# Patient Record
Sex: Female | Born: 1975 | Race: Asian | Hispanic: No | Marital: Married | Smoking: Never smoker
Health system: Southern US, Community
[De-identification: ages and names within clinical notes are randomized; demographics above are authoritative.]

## PROBLEM LIST (undated history)

## (undated) ENCOUNTER — Inpatient Hospital Stay (HOSPITAL_COMMUNITY): Payer: Self-pay

## (undated) DIAGNOSIS — F32A Depression, unspecified: Secondary | ICD-10-CM

## (undated) DIAGNOSIS — O24419 Gestational diabetes mellitus in pregnancy, unspecified control: Secondary | ICD-10-CM

## (undated) DIAGNOSIS — Z9889 Other specified postprocedural states: Secondary | ICD-10-CM

## (undated) DIAGNOSIS — D649 Anemia, unspecified: Secondary | ICD-10-CM

## (undated) DIAGNOSIS — F329 Major depressive disorder, single episode, unspecified: Secondary | ICD-10-CM

## (undated) HISTORY — PX: CHOLECYSTECTOMY: SHX55

---

## 2014-04-12 NOTE — L&D Delivery Note (Signed)
Delivery Note At 8:41 PM a non-viable female was delivered via Vaginal, Spontaneous Delivery (Presentation:double footling breech, encaul ).  APGAR: 1, 1; weight 9.1 oz (258 g).   Placenta status: Retained.  Cord: Unknown with the following complications: Prolapsed.- Avulsed   Anesthesia: None  Episiotomy: None Lacerations: None Suture Repair: N/A Est. Blood Loss (mL): 300 after D&E 1500  Mom to GYN.  Baby to stay with parents.  Bovard-Stuckert, Jody 01/10/2015, 10:40 PM  Pt with [facility] after avulsed cord - to OR - with [facility] - delay secondary to other complicated cases.

## 2014-12-31 NOTE — H&P (Signed)
Brianna Hall is an 39 y.o. female. G2P1001 who will be 18 wks and 4 days gestational age on 01/02/15 with EDC of 06/01/15 and is to present for scheduled cervical cerclage. Pt has had an uncomplicated pregnancy course thus far. She begun pregnancy care at 10 weeks and dates were confirmed via u/s. Early genetic screen (panorama) and CF were negative. Pt had a c/s with last pregnancy and planned on a repeat c/s this time. At anatomy scan today, cervix was noted to be significantly shortened at 0.87cm. Vaginal exam was done and confirmed this finding; cervical os was noted to be closed via digital exam today.  Implications and risk for preterm delivery were discussed. Pt is to start on vaginal progesterone today and then switch to 17a-hydroxy caproate injections next week ( order placed). Pt is also to get a cervical cerclage for which she presents today  Pertinent Gynecological History: Menses: LMP 08/25/14; pt is pregnant Bleeding: none Contraception: none DES exposure: unknown Blood transfusions: none Sexually transmitted diseases: no past history Previous GYN Procedures: none  Last mammogram: none Date:  Last pap: normal Date: 11/04/14 OB History: G2, P1001   Menstrual History: Menarche age: 59  No LMP recorded.    No past medical history on file.  No past surgical history on file.  No family history on file.  Social History:  has no tobacco, alcohol, and drug history on file.  Allergies: Allergies not on file  No prescriptions prior to admission    Review of Systems  Constitutional: Negative for fever and chills.  Respiratory: Negative for shortness of breath.   Cardiovascular: Negative for chest pain.  Gastrointestinal: Negative for heartburn, nausea, vomiting and abdominal pain.  Genitourinary: Negative for dysuria.  Musculoskeletal: Negative for back pain.  Neurological: Negative for dizziness and headaches.  Psychiatric/Behavioral: Negative for depression and suicidal  ideas. The patient is nervous/anxious.     There were no vitals taken for this visit. Physical Exam  Constitutional: She is oriented to person, place, and time. She appears well-developed and well-nourished.  HENT:  Head: Normocephalic.  Neck: Normal range of motion.  Respiratory: Effort normal.  GI: Soft.  Genitourinary: Vagina normal.  Shortened cervix; os closed. Measures 0.87cm on u/s  Musculoskeletal: Normal range of motion.  Neurological: She is alert and oriented to person, place, and time.  Skin: Skin is warm.  Psychiatric: She has a normal mood and affect. Her behavior is normal. Judgment and thought content normal.    No results found for this or any previous visit (from the past 24 hour(s)).  No results found.  Assessment/Plan: 38yo G2P1001 at 18 wks and 2days gestational age today for cervical cerclage in two days due to shortened cervix likely due to cervical insufficiency. Viable fetus confirmed today and to be confirmed via doppler prior to procedure and afterwards  Edwinna Areola 12/31/2014, 4:32 PM

## 2015-01-01 ENCOUNTER — Encounter (HOSPITAL_COMMUNITY): Payer: Self-pay | Admitting: *Deleted

## 2015-01-02 ENCOUNTER — Encounter (HOSPITAL_COMMUNITY): Payer: Self-pay | Admitting: Emergency Medicine

## 2015-01-02 ENCOUNTER — Ambulatory Visit (HOSPITAL_COMMUNITY): Payer: 59 | Admitting: Anesthesiology

## 2015-01-02 ENCOUNTER — Encounter (HOSPITAL_COMMUNITY)
Admission: RE | Disposition: A | Discharge status: Home / Self Care | Payer: Self-pay | Source: Ambulatory Visit | Attending: Obstetrics and Gynecology

## 2015-01-02 ENCOUNTER — Ambulatory Visit (HOSPITAL_COMMUNITY)
Admission: RE | Admit: 2015-01-02 | Discharge: 2015-01-02 | Disposition: A | Discharge status: Home / Self Care | Payer: 59 | Source: Ambulatory Visit | Attending: Obstetrics and Gynecology | Admitting: Obstetrics and Gynecology

## 2015-01-02 DIAGNOSIS — O26872 Cervical shortening, second trimester: Secondary | ICD-10-CM

## 2015-01-02 DIAGNOSIS — O3432 Maternal care for cervical incompetence, second trimester: Secondary | ICD-10-CM | POA: Insufficient documentation

## 2015-01-02 DIAGNOSIS — Z3A18 18 weeks gestation of pregnancy: Secondary | ICD-10-CM | POA: Diagnosis not present

## 2015-01-02 HISTORY — PX: CERVICAL CERCLAGE: SHX1329

## 2015-01-02 LAB — CBC
HCT: 35.3 % — ABNORMAL LOW (ref 36.0–46.0)
Hemoglobin: 12 g/dL (ref 12.0–15.0)
MCH: 31.3 pg (ref 26.0–34.0)
MCHC: 34 g/dL (ref 30.0–36.0)
MCV: 91.9 fL (ref 78.0–100.0)
PLATELETS: 279 10*3/uL (ref 150–400)
RBC: 3.84 MIL/uL — AB (ref 3.87–5.11)
RDW: 12.7 % (ref 11.5–15.5)
WBC: 10.8 10*3/uL — ABNORMAL HIGH (ref 4.0–10.5)

## 2015-01-02 LAB — TYPE AND SCREEN
ABO/RH(D): B POS
ANTIBODY SCREEN: NEGATIVE

## 2015-01-02 LAB — ABO/RH: ABO/RH(D): B POS

## 2015-01-02 SURGERY — CERCLAGE, CERVIX, VAGINAL APPROACH
Anesthesia: Spinal

## 2015-01-02 MED ORDER — LACTATED RINGERS IV SOLN
INTRAVENOUS | Status: DC
  Administered 2015-01-02 (×2): via INTRAVENOUS

## 2015-01-02 MED ORDER — FENTANYL CITRATE (PF) 100 MCG/2ML IJ SOLN: 25.0000 ug | INTRAMUSCULAR | Status: DC | PRN

## 2015-01-02 MED ORDER — METOCLOPRAMIDE HCL 5 MG/ML IJ SOLN: 10.0000 mg | Freq: Once | INTRAMUSCULAR | Status: DC | PRN

## 2015-01-02 MED ORDER — 0.9 % SODIUM CHLORIDE (POUR BTL) OPTIME
TOPICAL | Status: DC | PRN
  Administered 2015-01-02: 1000 mL

## 2015-01-02 MED ORDER — LIDOCAINE-EPINEPHRINE 1 %-1:100000 IJ SOLN
INTRAMUSCULAR | Status: AC
  Filled 2015-01-02: qty 1

## 2015-01-02 MED ORDER — MEPERIDINE HCL 25 MG/ML IJ SOLN: 6.2500 mg | INTRAMUSCULAR | Status: DC | PRN

## 2015-01-02 MED ORDER — LACTATED RINGERS IV SOLN
INTRAVENOUS | Status: DC
  Administered 2015-01-02: 15:00:00 via INTRAVENOUS

## 2015-01-02 MED ORDER — BUPIVACAINE IN DEXTROSE 0.75-8.25 % IT SOLN
INTRATHECAL | Status: DC | PRN
  Administered 2015-01-02: 1.2 mL via INTRATHECAL

## 2015-01-02 SURGICAL SUPPLY — 17 items
CLOTH BEACON ORANGE TIMEOUT ST (SAFETY) ×3 IMPLANT
COUNTER NEEDLE 1200 MAGNETIC (NEEDLE) ×3 IMPLANT
GLOVE BIO SURGEON STRL SZ 6.5 (GLOVE) ×4 IMPLANT
GLOVE BIO SURGEONS STRL SZ 6.5 (GLOVE) ×2
GOWN STRL REUS W/TWL LRG LVL3 (GOWN DISPOSABLE) ×6 IMPLANT
NEEDLE MAYO .5 CIRCLE (NEEDLE) ×3 IMPLANT
PACK VAGINAL MINOR WOMEN LF (CUSTOM PROCEDURE TRAY) ×3 IMPLANT
PAD OB MATERNITY 4.3X12.25 (PERSONAL CARE ITEMS) ×3 IMPLANT
PAD PREP 24X48 CUFFED NSTRL (MISCELLANEOUS) ×3 IMPLANT
SUT POLYDEK 5 CE 75 36 (SUTURE) ×6 IMPLANT
SYR BULB IRRIGATION 50ML (SYRINGE) ×3 IMPLANT
TOWEL OR 17X24 6PK STRL BLUE (TOWEL DISPOSABLE) ×6 IMPLANT
TRAY FOLEY CATH SILVER 14FR (SET/KITS/TRAYS/PACK) ×3 IMPLANT
TUBING NON-CON 1/4 X 20 CONN (TUBING) ×2 IMPLANT
TUBING NON-CON 1/4 X 20' CONN (TUBING) ×1
WATER STERILE IRR 1000ML POUR (IV SOLUTION) ×3 IMPLANT
YANKAUER SUCT BULB TIP NO VENT (SUCTIONS) ×3 IMPLANT

## 2015-01-02 NOTE — Anesthesia Postprocedure Evaluation (Signed)
  Anesthesia Post-op Note  Patient: Brianna Hall  Procedure(s) Performed: Procedure(s): CERCLAGE CERVICAL (N/A)  Patient Location: PACU  Anesthesia Type:Spinal  Level of Consciousness: awake, alert  and oriented  Airway and Oxygen Therapy: Patient Spontanous Breathing  Post-op Pain: none  Post-op Assessment: Post-op Vital signs reviewed, Patient's Cardiovascular Status Stable, Respiratory Function Stable, Patent Airway, No signs of Nausea or vomiting, Pain level controlled, No headache, No backache and Patient able to bend at knees LLE Motor Response: Purposeful movement LLE Sensation: Tingling RLE Motor Response: Purposeful movement RLE Sensation: Tingling L Sensory Level: S1-Sole of foot, small toes R Sensory Level: S1-Sole of foot, small toes  Post-op Vital Signs: Reviewed and stable  Last Vitals:  Filed Vitals:   01/02/15 1600  BP: 100/60  Pulse: 78  Temp: 36.8 C  Resp: 20    Complications: No apparent anesthesia complications

## 2015-01-02 NOTE — Anesthesia Preprocedure Evaluation (Signed)
Anesthesia Evaluation  Patient identified by MRN, date of birth, ID band Patient awake    Reviewed: Allergy & Precautions, NPO status , Patient's Chart, lab work & pertinent test results  Airway Mallampati: II  TM Distance: >3 FB Neck ROM: Full    Dental no notable dental hx. (+) Teeth Intact   Pulmonary neg pulmonary ROS,    Pulmonary exam normal breath sounds clear to auscultation       Cardiovascular negative cardio ROS Normal cardiovascular exam Rhythm:Regular Rate:Normal     Neuro/Psych negative neurological ROS  negative psych ROS   GI/Hepatic negative GI ROS, Neg liver ROS,   Endo/Other  negative endocrine ROS  Renal/GU negative Renal ROS  negative genitourinary   Musculoskeletal negative musculoskeletal ROS (+)   Abdominal   Peds  Hematology negative hematology ROS (+)   Anesthesia Other Findings   Reproductive/Obstetrics (+) Pregnancy Incompetent cervix 18 weeks                             Anesthesia Physical Anesthesia Plan  ASA: II  Anesthesia Plan: Spinal   Post-op Pain Management:    Induction:   Airway Management Planned: Natural Airway  Additional Equipment:   Intra-op Plan:   Post-operative Plan:   Informed Consent: I have reviewed the patients History and Physical, chart, labs and discussed the procedure including the risks, benefits and alternatives for the proposed anesthesia with the patient or authorized representative who has indicated his/her understanding and acceptance.     Plan Discussed with: CRNA, Anesthesiologist and Surgeon  Anesthesia Plan Comments:         Anesthesia Quick Evaluation

## 2015-01-02 NOTE — Op Note (Addendum)
Pt taken to OR and spinal anesthesia administered without complications. Pt placed in dorsal lithotomy position and appropriate time out done. Pt was prepped with betadine, foley catheter placed for bladder decompression and pt draped in sterile fashion. Exam under anesthesia confirmed cervix still closed. Speculum placed and local anesthesia with 1% lidocaine with epinephrine injected in a circumferential pattern at 12, 3, 6 and 9 o'clock. 0 mersilene suture placed circumferentially with traction provided using a ring forceps. Knot placed at 12 o'clock. Cervical length too short for a second suture. Essentially no bleeding noted at this time. All instruments then removed from pts vagina. Counts correct. Pt to recovery room in stable condition. Tolerated procedure well

## 2015-01-02 NOTE — Discharge Instructions (Addendum)
Nothing in vagina for 6 weeks.  No sex, tampons, and douching until after post op appointment  Notify your doctor if any of the following occur:  -Menstrual-like cramps, or sudden, constant, or occasional abdominal pain -Uterine contractions.  These may be painless and feel like the uterus is tightening or the baby is "balling up" -Low, dull backache, unrelieved by Tylenol -Intestinal cramps, with or without diarrhea, sometimes described as "gas pain" -Pelvic pressure (sudden increase) -Increase or change in vaginal discharge -Vaginal bleeding -A general feeling that "something is not right" -Leaking of fluid (sudden gushing of fluid from vagina-with or without continued leaking) -Fainting spells, "black outs" or loss of consciousness -Severe or continued nausea or vomiting -Pain or burning when urinating Chills or fever greater than 100.74F -Baby moving less than usual( Lie on your left side for one hour after a meal, and count the number of times your baby kicks.  If it is less than 5 times, get up, move around and drink some juice.  Repeat the test 30 minutes later.  If it is still less than 5 kicks in an hour, notify your doctor.) -Blurring vision or spots before your eyes

## 2015-01-02 NOTE — Transfer of Care (Signed)
Immediate Anesthesia Transfer of Care Note  Patient: Brianna Hall  Procedure(s) Performed: Procedure(s): CERCLAGE CERVICAL (N/A)  Patient Location: PACU  Anesthesia Type:Spinal  Level of Consciousness: awake, alert  and oriented  Airway & Oxygen Therapy: Patient Spontanous Breathing  Post-op Assessment: Report given to RN and Post -op Vital signs reviewed and stable  Post vital signs: Reviewed and stable  Last Vitals:  Filed Vitals:   01/02/15 1125  BP: 108/72  Pulse: 82  Temp: 36.8 C  Resp: 16    Complications: No apparent anesthesia complications

## 2015-01-02 NOTE — Interval H&P Note (Signed)
History and Physical Interval Note: Pt seen this am. Reports no change since last seen in office - no contractions or cramping and no VB. Questions and concerns re-addressed. Ready for OR when available. Consent verified again  01/02/2015 12:20 PM  Magali Sook Heaps  has presented today for surgery, with the diagnosis of Incompetent Cervix  The various methods of treatment have been discussed with the patient and family. After consideration of risks, benefits and other options for treatment, the patient has consented to  Procedure(s): CERCLAGE CERVICAL (N/A) as a surgical intervention .  The patient's history has been reviewed, patient examined, no change in status, stable for surgery.  I have reviewed the patient's chart and labs.  Questions were answered to the patient's satisfaction.     Cecilia [facility]

## 2015-01-02 NOTE — Anesthesia Procedure Notes (Signed)
Spinal Patient location during procedure: OR Start time: 01/02/2015 12:33 PM Staffing Anesthesiologist: Mal Amabile Performed by: anesthesiologist  Preanesthetic Checklist Completed: patient identified, site marked, surgical consent, pre-op evaluation, timeout performed, IV checked, risks and benefits discussed and monitors and equipment checked Spinal Block Patient position: sitting Prep: site prepped and draped and DuraPrep Patient monitoring: heart rate, cardiac monitor, continuous pulse ox and blood pressure Approach: midline Location: L3-4 Injection technique: single-shot Needle Needle type: Sprotte  Needle gauge: 24 G Needle length: 9 cm Needle insertion depth: 5 cm Assessment Sensory level: T6 Additional Notes Patient tolerated procedure well. Adequate sensory level.

## 2015-01-03 ENCOUNTER — Encounter (HOSPITAL_COMMUNITY): Payer: Self-pay | Admitting: Obstetrics and Gynecology

## 2015-01-07 ENCOUNTER — Encounter (HOSPITAL_COMMUNITY): Payer: Self-pay | Admitting: *Deleted

## 2015-01-07 ENCOUNTER — Inpatient Hospital Stay (HOSPITAL_COMMUNITY)
Admission: AD | Admit: 2015-01-07 | Discharge: 2015-01-12 | DRG: 767 | Disposition: A | Discharge status: Home / Self Care | Payer: 59 | Source: Ambulatory Visit | Attending: Obstetrics and Gynecology | Admitting: Obstetrics and Gynecology

## 2015-01-07 ENCOUNTER — Inpatient Hospital Stay (HOSPITAL_COMMUNITY): Payer: 59

## 2015-01-07 DIAGNOSIS — D649 Anemia, unspecified: Secondary | ICD-10-CM | POA: Diagnosis present

## 2015-01-07 DIAGNOSIS — O3430 Maternal care for cervical incompetence, unspecified trimester: Secondary | ICD-10-CM | POA: Diagnosis present

## 2015-01-07 DIAGNOSIS — Z3A18 18 weeks gestation of pregnancy: Secondary | ICD-10-CM | POA: Diagnosis not present

## 2015-01-07 DIAGNOSIS — O209 Hemorrhage in early pregnancy, unspecified: Secondary | ICD-10-CM

## 2015-01-07 DIAGNOSIS — D62 Acute posthemorrhagic anemia: Secondary | ICD-10-CM | POA: Diagnosis present

## 2015-01-07 DIAGNOSIS — O26872 Cervical shortening, second trimester: Secondary | ICD-10-CM | POA: Diagnosis present

## 2015-01-07 DIAGNOSIS — Z3A19 19 weeks gestation of pregnancy: Secondary | ICD-10-CM

## 2015-01-07 DIAGNOSIS — O3432 Maternal care for cervical incompetence, second trimester: Principal | ICD-10-CM | POA: Diagnosis present

## 2015-01-07 DIAGNOSIS — N883 Incompetence of cervix uteri: Secondary | ICD-10-CM

## 2015-01-07 DIAGNOSIS — Z9889 Other specified postprocedural states: Secondary | ICD-10-CM

## 2015-01-07 DIAGNOSIS — O42912 Preterm premature rupture of membranes, unspecified as to length of time between rupture and onset of labor, second trimester: Secondary | ICD-10-CM

## 2015-01-07 HISTORY — DX: Other specified postprocedural states: Z98.890

## 2015-01-07 LAB — CBC WITH DIFFERENTIAL/PLATELET
Basophils Absolute: 0 10*3/uL (ref 0.0–0.1)
Basophils Relative: 0 %
Eosinophils Absolute: 0.1 10*3/uL (ref 0.0–0.7)
Eosinophils Relative: 0 %
HEMATOCRIT: 32.6 % — AB (ref 36.0–46.0)
Hemoglobin: 11.2 g/dL — ABNORMAL LOW (ref 12.0–15.0)
LYMPHS ABS: 1.6 10*3/uL (ref 0.7–4.0)
LYMPHS PCT: 9 %
MCH: 31.5 pg (ref 26.0–34.0)
MCHC: 34.4 g/dL (ref 30.0–36.0)
MCV: 91.6 fL (ref 78.0–100.0)
MONO ABS: 0.8 10*3/uL (ref 0.1–1.0)
MONOS PCT: 5 %
NEUTROS ABS: 14.8 10*3/uL — AB (ref 1.7–7.7)
Neutrophils Relative %: 86 %
Platelets: 230 10*3/uL (ref 150–400)
RBC: 3.56 MIL/uL — ABNORMAL LOW (ref 3.87–5.11)
RDW: 13 % (ref 11.5–15.5)
WBC: 17.2 10*3/uL — ABNORMAL HIGH (ref 4.0–10.5)

## 2015-01-07 LAB — URINALYSIS, ROUTINE W REFLEX MICROSCOPIC
Bilirubin Urine: NEGATIVE
GLUCOSE, UA: NEGATIVE mg/dL
Ketones, ur: NEGATIVE mg/dL
LEUKOCYTES UA: NEGATIVE
Nitrite: NEGATIVE
PH: 6.5 (ref 5.0–8.0)
Protein, ur: NEGATIVE mg/dL
Specific Gravity, Urine: <1.005 — ABNORMAL LOW (ref 1.005–1.030)
Urobilinogen, UA: 0.2 mg/dL (ref 0.0–1.0)

## 2015-01-07 LAB — URINE MICROSCOPIC-ADD ON

## 2015-01-07 MED ORDER — ZOLPIDEM TARTRATE 5 MG PO TABS: 5.0000 mg | ORAL_TABLET | Freq: Every evening | ORAL | Status: DC | PRN

## 2015-01-07 MED ORDER — PRENATAL MULTIVITAMIN CH
1.0000 | ORAL_TABLET | Freq: Every day | ORAL | Status: DC
  Administered 2015-01-08: 1 via ORAL
  Filled 2015-01-07 (×3): qty 1

## 2015-01-07 MED ORDER — ACETAMINOPHEN 325 MG PO TABS: 650.0000 mg | ORAL_TABLET | ORAL | Status: DC | PRN

## 2015-01-07 MED ORDER — DOCUSATE SODIUM 100 MG PO CAPS
100.0000 mg | ORAL_CAPSULE | Freq: Every day | ORAL | Status: DC
  Administered 2015-01-08: 100 mg via ORAL
  Filled 2015-01-07 (×3): qty 1

## 2015-01-07 MED ORDER — AMPICILLIN-SULBACTAM SODIUM 3 (2-1) G IJ SOLR
3.0000 g | Freq: Four times a day (QID) | INTRAMUSCULAR | Status: DC
  Administered 2015-01-08 – 2015-01-09 (×6): 3 g via INTRAVENOUS
  Filled 2015-01-07 (×7): qty 3

## 2015-01-07 MED ORDER — CALCIUM CARBONATE ANTACID 500 MG PO CHEW
2.0000 | CHEWABLE_TABLET | ORAL | Status: DC | PRN
  Filled 2015-01-07: qty 2

## 2015-01-07 NOTE — MAU Note (Signed)
Pt s/p cerclage on 09/22, reports lower abd pain this afternoon. Continues to have pinkish discharge.

## 2015-01-07 NOTE — MAU Provider Note (Signed)
History     CSN: [id number]  Arrival date and time: 01/07/15 2141   First Provider Initiated Contact with Patient 01/07/15 2213        No chief complaint on file.  HPI  [name] is a 39 y.o. G2P1001 at [redacted]w[redacted]d who presents to MAU today with complaint of increased abdominal pain and pressure and vaginal bleeding. The patient had a cerclage placed on 01/02/15 for a shortened cervix. Per patient cervix was < 1 cm. She states that pain started yesterday. She rates pain at 6/10 upon arrival in MAU. She states that pain comes in waves. She has not taken anything for pain. She has also noted a light spotting since yesterday, although more since arrival in MAU. She is concerned that she may also be have LOF since it is very thin and watery. She has felt intermittent fetal movements recently. She denies dysuria, but feels increased frequency of urination tonight. She denies other vaginal discharge.   OB History    Gravida Para Term Preterm AB TAB SAB Ectopic Multiple Living   0 0 0 0 0 0 1      Past Medical History  Diagnosis Date  . Medical history non-contributory     Past Surgical History  Procedure Laterality Date  . Cholecystectomy    . Cervical cerclage N/A 01/02/2015    Procedure: CERCLAGE CERVICAL;  Surgeon: Edwinna Areola, DO;  Location: [facility];  Service: Gynecology;  Laterality: N/A;  . Cesarean section    . Cholecystectomy      History reviewed. No pertinent family history.  Social History  Substance Use Topics  . Smoking status: Never Smoker   . Smokeless tobacco: None  . Alcohol Use: No    Allergies: No Known Allergies  Prescriptions prior to admission  Medication Sig Dispense Refill Last Dose  . Prenatal Vit-Fe Fumarate-FA (PRENATAL MULTIVITAMIN) TABS tablet Take 1 tablet by mouth daily at 12 noon.   01/01/2015 at Unknown time    Review of Systems  Constitutional: Negative for fever and malaise/fatigue.  Gastrointestinal: Positive for  abdominal pain. Negative for nausea, vomiting, diarrhea and constipation.  Genitourinary: Negative for dysuria, urgency and frequency.       + vaginal bleeding, LOF Neg - vaginal discharge   Physical Exam   Blood pressure 116/67, pulse 79, temperature 99 F (37.2 C), temperature source Oral, resp. rate 20, height  (1.549 m), weight 158 lb 4 oz (71.782 kg), last menstrual period 08/31/2014, SpO2 100 %.  Physical Exam  Nursing note and vitals reviewed. Constitutional: She is oriented to person, place, and time. She appears well-developed and well-nourished. No distress.  HENT:  Head: Normocephalic and atraumatic.  Cardiovascular: Normal rate.   Respiratory: Effort normal.  GI: Soft. She exhibits no distension and no mass. There is tenderness (mild lower abdominal tenderness to palpation). There is no rebound and no guarding.  Genitourinary: Uterus is enlarged (appropriate for GA). Cervix exhibits no motion tenderness. There is bleeding (small amount of thin, light blood noted pooling in the vaginal vault) in the vagina. No vaginal discharge found.  Cervix is very thin. Able to palpate string from cerclage which feels to be intact.   Unable to visualize cerclage as patient is unable to tolerate further exam with speculum.   Neurological: She is alert and oriented to person, place, and time.  Skin: Skin is warm and dry. No erythema.  Psychiatric: She has a normal mood and affect.  Results for orders placed or performed during the hospital encounter of 01/07/15 (from the past 24 hour(s))  Urinalysis, Routine w reflex microscopic (not at [facility])     Status: Abnormal   Collection Time: 01/07/15 10:00 PM  Result Value Ref Range   Color, Urine YELLOW YELLOW   APPearance CLEAR CLEAR   Specific Gravity, Urine <1.005 (L) 1.005 - 1.030   pH 6.5 5.0 - 8.0   Glucose, UA NEGATIVE NEGATIVE mg/dL   Hgb urine dipstick TRACE (A) NEGATIVE   Bilirubin Urine NEGATIVE NEGATIVE   Ketones, ur NEGATIVE  NEGATIVE mg/dL   Protein, ur NEGATIVE NEGATIVE mg/dL   Urobilinogen, UA 0.2 0.0 - 1.0 mg/dL   Nitrite NEGATIVE NEGATIVE   Leukocytes, UA NEGATIVE NEGATIVE  Urine microscopic-add on     Status: None   Collection Time: 01/07/15 10:00 PM  Result Value Ref Range   Squamous Epithelial / LPF RARE RARE   WBC, UA 0-2 <3 WBC/hpf   RBC / HPF 0-2 <3 RBC/hpf   Bacteria, UA RARE RARE  CBC with Differential/Platelet     Status: Abnormal   Collection Time: 01/07/15 11:30 PM  Result Value Ref Range   WBC 17.2 (H) 4.0 - 10.5 K/uL   RBC 3.56 (L) 3.87 - 5.11 MIL/uL   Hemoglobin 11.2 (L) 12.0 - 15.0 g/dL   HCT 96.0 (L) 45.4 - 09.8 %   MCV 91.6 78.0 - 100.0 fL   MCH 31.5 26.0 - 34.0 pg   MCHC 34.4 30.0 - 36.0 g/dL   RDW 11.9 14.7 - 82.9 %   Platelets 230 150 - 400 K/uL   Neutrophils Relative % 86 %   Neutro Abs 14.8 (H) 1.7 - 7.7 K/uL   Lymphocytes Relative 9 %   Lymphs Abs 1.6 0.7 - 4.0 K/uL   Monocytes Relative 5 %   Monocytes Absolute 0.8 0.1 - 1.0 K/uL   Eosinophils Relative 0 %   Eosinophils Absolute 0.1 0.0 - 0.7 K/uL   Basophils Relative 0 %   Basophils Absolute 0.0 0.0 - 0.1 K/uL    MAU Course  Procedures None  MDM FHR - 167 bpm with doppler UA, CBC and [place] today Discussed with Dr. Jackelyn Knife. He agrees with plan for CBC and [place] today to assess AFI and cervical length.  US shows normal AFI and unmeasurable cervical length with interior funneling.  Discussed lab and [place] results with Dr. Jackelyn Knife. Will admit to inpatient for IV antibiotics and observation.  Assessment and Plan  A: SIUP at [redacted]w[redacted]d Cervical incompetence Vaginal bleeding Cerclage in place  P: Admit to inpatient for IV antibiotics and observation  Marny Lowenstein, PA-C  01/08/2015, 12:02 AM

## 2015-01-08 ENCOUNTER — Encounter (HOSPITAL_COMMUNITY): Payer: Self-pay | Admitting: *Deleted

## 2015-01-08 DIAGNOSIS — O3432 Maternal care for cervical incompetence, second trimester: Principal | ICD-10-CM

## 2015-01-08 LAB — TYPE AND SCREEN
ABO/RH(D): B POS
ANTIBODY SCREEN: NEGATIVE

## 2015-01-08 MED ORDER — INDOMETHACIN 25 MG PO CAPS
25.0000 mg | ORAL_CAPSULE | Freq: Four times a day (QID) | ORAL | Status: DC
  Administered 2015-01-08 – 2015-01-10 (×6): 25 mg via ORAL
  Filled 2015-01-08 (×9): qty 1

## 2015-01-08 MED ORDER — PROGESTERONE MICRONIZED 200 MG PO CAPS
200.0000 mg | ORAL_CAPSULE | Freq: Every day | ORAL | Status: DC
  Administered 2015-01-08: 200 mg via ORAL
  Filled 2015-01-08 (×2): qty 1

## 2015-01-08 MED ORDER — BUTORPHANOL TARTRATE 1 MG/ML IJ SOLN
1.0000 mg | Freq: Once | INTRAMUSCULAR | Status: AC
  Administered 2015-01-08: 1 mg via INTRAVENOUS
  Filled 2015-01-08: qty 1

## 2015-01-08 MED ORDER — INDOMETHACIN 50 MG PO CAPS
50.0000 mg | ORAL_CAPSULE | ORAL | Status: AC
  Administered 2015-01-08: 50 mg via ORAL
  Filled 2015-01-08: qty 1

## 2015-01-08 MED ORDER — NIFEDIPINE 10 MG PO CAPS
20.0000 mg | ORAL_CAPSULE | Freq: Once | ORAL | Status: AC
  Administered 2015-01-08: 20 mg via ORAL

## 2015-01-08 MED ORDER — NIFEDIPINE 10 MG PO CAPS
20.0000 mg | ORAL_CAPSULE | Freq: Four times a day (QID) | ORAL | Status: DC
  Administered 2015-01-08 – 2015-01-10 (×6): 20 mg via ORAL
  Filled 2015-01-08 (×15): qty 2

## 2015-01-08 NOTE — H&P (Signed)
HPI  [name] is a 39 y.o. G2P1001 at [redacted]w[redacted]d who presents to MAU today with complaint of increased abdominal pain and pressure and vaginal bleeding. The patient had a cerclage placed on 01/02/15 for a shortened cervix. Per patient cervix was < 1 cm. She states that pain started yesterday. She rates pain at 6/10 upon arrival in MAU. She states that pain comes in waves. She has not taken anything for pain. She has also noted a light spotting since yesterday, although more since arrival in MAU. She is concerned that she may also be have LOF since it is very thin and watery. She has felt intermittent fetal movements recently. She denies dysuria, but feels increased frequency of urination tonight. She denies other vaginal discharge.   OB History    Gravida Para Term Preterm AB TAB SAB Ectopic Multiple Living   0 0 0 0 0 0 1      Past Medical History  Diagnosis Date  . Medical history non-contributory     Past Surgical History  Procedure Laterality Date  . Cholecystectomy    . Cervical cerclage N/A 01/02/2015    Procedure: CERCLAGE CERVICAL; Surgeon: Edwinna Areola, DO; Location: [facility]; Service: Gynecology; Laterality: N/A;  . Cesarean section    . Cholecystectomy      History reviewed. No pertinent family history.  Social History  Substance Use Topics  . Smoking status: Never Smoker   . Smokeless tobacco: None  . Alcohol Use: No    Allergies: No Known Allergies  Prescriptions prior to admission  Medication Sig Dispense Refill Last Dose  . Prenatal Vit-Fe Fumarate-FA (PRENATAL MULTIVITAMIN) TABS tablet Take 1 tablet by mouth daily at 12 noon.   01/01/2015 at Unknown time    Review of Systems  Constitutional: Negative for fever and malaise/fatigue.  Gastrointestinal: Positive for abdominal pain. Negative for nausea, vomiting, diarrhea and constipation.  Genitourinary:  Negative for dysuria, urgency and frequency.   + vaginal bleeding, LOF Neg - vaginal discharge   Physical Exam   Blood pressure 116/67, pulse 79, temperature 99 F (37.2 C), temperature source Oral, resp. rate 20, height  (1.549 m), weight 158 lb 4 oz (71.782 kg), last menstrual period 08/31/2014, SpO2 100 %.  Physical Exam  Nursing note and vitals reviewed. Constitutional: She is oriented to person, place, and time. She appears well-developed and well-nourished. No distress.  HENT:  Head: Normocephalic and atraumatic.  Cardiovascular: Normal rate.  Respiratory: Effort normal.  GI: Soft. She exhibits no distension and no mass. There is tenderness (mild lower abdominal tenderness to palpation). There is no rebound and no guarding.  Genitourinary: Uterus is enlarged (appropriate for GA). Cervix exhibits no motion tenderness. There is bleeding (small amount of thin, light blood noted pooling in the vaginal vault) in the vagina. No vaginal discharge found.  Cervix is very thin. Able to palpate string from cerclage which feels to be intact.   Unable to visualize cerclage as patient is unable to tolerate further exam with speculum.  Neurological: She is alert and oriented to person, place, and time.  Skin: Skin is warm and dry. No erythema.  Psychiatric: She has a normal mood and affect.    Lab Results Last 24 Hours    Results for orders placed or performed during the hospital encounter of 01/07/15 (from the past 24 hour(s))  Urinalysis, Routine w reflex microscopic (not at [facility]) Status: Abnormal   Collection Time: 01/07/15 10:00 PM  Result  Value Ref Range   Color, Urine YELLOW YELLOW   APPearance CLEAR CLEAR   Specific Gravity, Urine <1.005 (L) 1.005 - 1.030   pH 6.5 5.0 - 8.0   Glucose, UA NEGATIVE NEGATIVE mg/dL   Hgb urine dipstick TRACE (A) NEGATIVE   Bilirubin Urine NEGATIVE NEGATIVE   Ketones, ur NEGATIVE NEGATIVE mg/dL    Protein, ur NEGATIVE NEGATIVE mg/dL   Urobilinogen, UA 0.2 0.0 - 1.0 mg/dL   Nitrite NEGATIVE NEGATIVE   Leukocytes, UA NEGATIVE NEGATIVE  Urine microscopic-add on Status: None   Collection Time: 01/07/15 10:00 PM  Result Value Ref Range   Squamous Epithelial / LPF RARE RARE   WBC, UA 0-2 <3 WBC/hpf   RBC / HPF 0-2 <3 RBC/hpf   Bacteria, UA RARE RARE  CBC with Differential/Platelet Status: Abnormal   Collection Time: 01/07/15 11:30 PM  Result Value Ref Range   WBC 17.2 (H) 4.0 - 10.5 K/uL   RBC 3.56 (L) 3.87 - 5.11 MIL/uL   Hemoglobin 11.2 (L) 12.0 - 15.0 g/dL   HCT 16.1 (L) 09.6 - 04.5 %   MCV 91.6 78.0 - 100.0 fL   MCH 31.5 26.0 - 34.0 pg   MCHC 34.4 30.0 - 36.0 g/dL   RDW 40.9 81.1 - 91.4 %   Platelets 230 150 - 400 K/uL   Neutrophils Relative % 86 %   Neutro Abs 14.8 (H) 1.7 - 7.7 K/uL   Lymphocytes Relative 9 %   Lymphs Abs 1.6 0.7 - 4.0 K/uL   Monocytes Relative 5 %   Monocytes Absolute 0.8 0.1 - 1.0 K/uL   Eosinophils Relative 0 %   Eosinophils Absolute 0.1 0.0 - 0.7 K/uL   Basophils Relative 0 %   Basophils Absolute 0.0 0.0 - 0.1 K/uL      MAU Course  Procedures None  MDM FHR - 167 bpm with doppler UA, CBC and [place] today Discussed with Dr. Jackelyn Knife. He agrees with plan for CBC and [place] today to assess AFI and cervical length.  US shows normal AFI and unmeasurable cervical length with interior funneling.  Discussed lab and [place] results with Dr. Jackelyn Knife. Will admit to inpatient for IV antibiotics and observation.  Assessment and Plan  A: SIUP at [redacted]w[redacted]d Cervical incompetence Vaginal bleeding Cerclage in place  P: Admit to inpatient for IV antibiotics and observation     With elevated WBC, cerclage and symptoms I am concerned for possibility of chorioamnionitis.  She also started having more cramping.  Will admit and put on  Unasyn, pain meds prn, received one dose of Procardia also right after admission.

## 2015-01-08 NOTE — Progress Notes (Signed)
Patient sitting up she states she feels better.

## 2015-01-08 NOTE — MAU Note (Signed)
Report called to [facility] on BS. Will go to [phone]

## 2015-01-08 NOTE — MAU Note (Signed)
Christy on BS called back regarding pt level of discomfort and that we would bring patient on stretcher with PA. RN verbalized understanding and will meet [place] in the room

## 2015-01-08 NOTE — Progress Notes (Addendum)
Patient ID: Brianna Hall, female   DOB: 1975-09-11, 39 y.o.   MRN: [id number] Pt resting comfortably this am. She reports she still feels a little crampy but significantly better than on arrival. Has not noted any further bleeding. Admits did not start taking vaginal progesterone prescribed last week until two days ago - and only took once on that day orally. Pt is currently tolerating IV antibiotics and denies any fever or chills or LOF. Plan: will continue with fetal heart tones q 8hrs          Will continue on iv unasyn; will recheck cbc in am          Will start pt on progesterone today; pt to get a dose of 17P and also start on daily vaginal                             progesterone          Pt agrees to follow recommended plan of care

## 2015-01-09 LAB — CBC
HCT: 27.6 % — ABNORMAL LOW (ref 36.0–46.0)
HEMATOCRIT: 28.3 % — AB (ref 36.0–46.0)
HEMOGLOBIN: 9.3 g/dL — AB (ref 12.0–15.0)
HEMOGLOBIN: 9.5 g/dL — AB (ref 12.0–15.0)
MCH: 31 pg (ref 26.0–34.0)
MCH: 31 pg (ref 26.0–34.0)
MCHC: 33.7 g/dL (ref 30.0–36.0)
MCHC: 33.6 g/dL (ref 30.0–36.0)
MCV: 92 fL (ref 78.0–100.0)
MCV: 92.5 fL (ref 78.0–100.0)
PLATELETS: 214 10*3/uL (ref 150–400)
Platelets: 213 10*3/uL (ref 150–400)
RBC: 3 MIL/uL — AB (ref 3.87–5.11)
RBC: 3.06 MIL/uL — AB (ref 3.87–5.11)
RDW: 12.9 % (ref 11.5–15.5)
RDW: 12.9 % (ref 11.5–15.5)
WBC: 15.5 10*3/uL — AB (ref 4.0–10.5)
WBC: 15.3 10*3/uL — AB (ref 4.0–10.5)

## 2015-01-09 MED ORDER — PRENATAL MULTIVITAMIN CH
1.0000 | ORAL_TABLET | Freq: Every day | ORAL | Status: DC
  Administered 2015-01-09 – 2015-01-10 (×2): 1 via ORAL
  Filled 2015-01-09 (×2): qty 1

## 2015-01-09 MED ORDER — SODIUM CHLORIDE 0.9 % IV SOLN
3.0000 g | Freq: Four times a day (QID) | INTRAVENOUS | Status: DC
  Administered 2015-01-09 – 2015-01-10 (×4): 3 g via INTRAVENOUS
  Filled 2015-01-09 (×5): qty 3

## 2015-01-09 MED ORDER — CALCIUM CARBONATE ANTACID 500 MG PO CHEW: 2.0000 | CHEWABLE_TABLET | ORAL | Status: DC | PRN

## 2015-01-09 MED ORDER — DOCUSATE SODIUM 100 MG PO CAPS
100.0000 mg | ORAL_CAPSULE | Freq: Every day | ORAL | Status: DC
  Administered 2015-01-09 – 2015-01-10 (×2): 100 mg via ORAL
  Filled 2015-01-09 (×2): qty 1

## 2015-01-09 MED ORDER — HYDROXYPROGESTERONE CAPROATE 250 MG/ML IM OIL
250.0000 mg | TOPICAL_OIL | Freq: Once | INTRAMUSCULAR | Status: AC
  Administered 2015-01-09: 250 mg via INTRAMUSCULAR
  Filled 2015-01-09: qty 1

## 2015-01-09 MED ORDER — PROGESTERONE MICRONIZED 200 MG PO CAPS
200.0000 mg | ORAL_CAPSULE | Freq: Every day | ORAL | Status: DC
Start: 2015-01-09 — End: 2015-01-09
  Administered 2015-01-09: 200 mg via VAGINAL
  Filled 2015-01-09: qty 1

## 2015-01-09 MED ORDER — ZOLPIDEM TARTRATE 5 MG PO TABS: 5.0000 mg | ORAL_TABLET | Freq: Every evening | ORAL | Status: DC | PRN

## 2015-01-09 MED ORDER — ACETAMINOPHEN 325 MG PO TABS: 650.0000 mg | ORAL_TABLET | ORAL | Status: DC | PRN

## 2015-01-09 NOTE — Progress Notes (Signed)
1553 -  Dr. Senaida Ores notified of EKG results - NS possible right ventricular hypertrophy.  No new orders at this time.

## 2015-01-09 NOTE — Progress Notes (Signed)
Patient ID: Brianna Hall, female   DOB: 1975-11-16, 39 y.o.   MRN: [id number] Pt sleeping comfortably. Per husband has had no complaints of cramping or leakage of fluid. Still has intermittent spotting. Received vaginal progesterone this am. No fever or chills. Tolerating diet well Plan to continue on unasyn ( wbc down from 17 to 15) Will recheck cbc at 1800 today Procardia help this am due to low bp ( albeit asymptomatic); will conisder restarting at noon Will continue to try to get 17P for pt so can get started on it Aware could be in hospital for a while Continue with nst q 8hrs

## 2015-01-09 NOTE — Progress Notes (Signed)
1258 - Dr. Mindi Slicker notified RN that 17P had been approved by the patient's insurance.  Gave order for patient to receive the 17 P once a week.  The first dose will be given by the hospital pharmacy.  Patient will then get the following doses filled outpatient.  The physician's office has ordered the remaining doses.  Patient stated she had chest tightness when she tries to take a deep breath, O2 sats 98% on RA, respirations 18, no shortness of breath.  Dr. Mindi Slicker notified.  Stated to continue to monitor, patient not moving around a lot due to bed rest, continue deep breathing.

## 2015-01-09 NOTE — Progress Notes (Signed)
Patient ID: Brianna Hall, female   DOB: 1975-08-26, 39 y.o.   MRN: [id number] Late entry note:Pt awake and alert. Denies any contractions or leakage of fluid. Has had some intermittent spotting since arrival at hospital. She has several questions about care already administered and future plan of care. Informed about negligible cervix noted on u/s - still closed with intact cerclage. She finally appears to understand gravity of incompetent cervix at his gestational age - she agrees to continue with vaginal progesterone if unable to get approved for 17P. Attempted to get 17P through hospital pharmacy but informed very limited supply available and advised to try to get through outside pharmacy  Husband called insurance company later in pm and did a three way call with me. Request for authorization expedited and advised would hear back in 24-48hours.  Will continue with current treatment for pt: antibiotic treatment ( unasyn), procardia q 6 ( will watch bp); vaginal progesterone daily and bedrest with bathroom priviledges

## 2015-01-09 NOTE — Progress Notes (Signed)
Patient c/o chest pain/tightness.  States with deep breathing.  Patient has a dry cough at times.  Deep breathing makes her cough.  Patient reports feeling anxious about what is going on and "anxious about other things."  Patient verbalizes that her "son is turning six tomorrow and we have a party planned for Sunday.  I may have to cancel it."  Pt begins to cry.  Dr. Senaida Ores notified.  Dr. Senaida Ores gave order for EKG 12 lead and Xanax 0.5 mg po one time dose if patient agrees.

## 2015-01-09 NOTE — Progress Notes (Signed)
Fetal heart tones via doppler range 171-194.  Patient c/o "mild cramping"  Pain 4-5/10.  Dr. Senaida Ores notified.  No new orders at this time.

## 2015-01-09 NOTE — Progress Notes (Signed)
Patient ID: Brianna Hall, female   DOB: 04-25-1975, 39 y.o.   MRN: [id number] Pt resting comfortably Reported ongoing chest tightness,m worse with deep breath and has a dry cough Pulse ox 100% and EKG WNL Good FHT's WBC remains the same at about 15K  D/w pt again that she is better here at hospital for now until we determine if she is beginning an infection Husband and she have been counseled extensively, she would like a short shower and will allow

## 2015-01-10 ENCOUNTER — Inpatient Hospital Stay (HOSPITAL_COMMUNITY): Payer: 59

## 2015-01-10 ENCOUNTER — Inpatient Hospital Stay (HOSPITAL_COMMUNITY): Payer: 59 | Admitting: Anesthesiology

## 2015-01-10 ENCOUNTER — Encounter (HOSPITAL_COMMUNITY): Payer: Self-pay | Admitting: *Deleted

## 2015-01-10 ENCOUNTER — Encounter (HOSPITAL_COMMUNITY)
Admission: AD | Disposition: A | Discharge status: Home / Self Care | Payer: Self-pay | Source: Ambulatory Visit | Attending: Obstetrics and Gynecology

## 2015-01-10 DIAGNOSIS — Z9889 Other specified postprocedural states: Secondary | ICD-10-CM

## 2015-01-10 HISTORY — DX: Other specified postprocedural states: Z98.890

## 2015-01-10 HISTORY — PX: DILATION AND EVACUATION: SHX1459

## 2015-01-10 LAB — CBC
HCT: 28.1 % — ABNORMAL LOW (ref 36.0–46.0)
HCT: 23.4 % — ABNORMAL LOW (ref 36.0–46.0)
HEMATOCRIT: 28 % — AB (ref 36.0–46.0)
HEMOGLOBIN: 9.3 g/dL — AB (ref 12.0–15.0)
HEMOGLOBIN: 9.4 g/dL — AB (ref 12.0–15.0)
Hemoglobin: 8 g/dL — ABNORMAL LOW (ref 12.0–15.0)
MCH: 30.8 pg (ref 26.0–34.0)
MCH: 31.5 pg (ref 26.0–34.0)
MCH: 31.5 pg (ref 26.0–34.0)
MCHC: 33.2 g/dL (ref 30.0–36.0)
MCHC: 33.5 g/dL (ref 30.0–36.0)
MCHC: 34.2 g/dL (ref 30.0–36.0)
MCV: 92.7 fL (ref 78.0–100.0)
MCV: 94.3 fL (ref 78.0–100.0)
MCV: 92.1 fL (ref 78.0–100.0)
PLATELETS: 242 10*3/uL (ref 150–400)
Platelets: 208 10*3/uL (ref 150–400)
Platelets: 266 10*3/uL (ref 150–400)
RBC: 3.02 MIL/uL — ABNORMAL LOW (ref 3.87–5.11)
RBC: 2.98 MIL/uL — ABNORMAL LOW (ref 3.87–5.11)
RBC: 2.54 MIL/uL — ABNORMAL LOW (ref 3.87–5.11)
RDW: 13.1 % (ref 11.5–15.5)
RDW: 12.7 % (ref 11.5–15.5)
RDW: 13 % (ref 11.5–15.5)
WBC: 13.6 10*3/uL — ABNORMAL HIGH (ref 4.0–10.5)
WBC: 13 10*3/uL — ABNORMAL HIGH (ref 4.0–10.5)
WBC: 19.3 10*3/uL — ABNORMAL HIGH (ref 4.0–10.5)

## 2015-01-10 LAB — PREPARE RBC (CROSSMATCH)

## 2015-01-10 LAB — 17-HYDROXYPROGESTERONE: 17-OH-PROGESTERONE, LC/MS/MS: 151 ng/dL

## 2015-01-10 SURGERY — DILATION AND EVACUATION, UTERUS
Anesthesia: Monitor Anesthesia Care | Site: Uterus

## 2015-01-10 MED ORDER — PHENYLEPHRINE 40 MCG/ML (10ML) SYRINGE FOR IV PUSH (FOR BLOOD PRESSURE SUPPORT)
PREFILLED_SYRINGE | INTRAVENOUS | Status: DC | PRN
  Administered 2015-01-10 (×2): 80 ug via INTRAVENOUS
  Administered 2015-01-10: 20 ug via INTRAVENOUS
  Administered 2015-01-10 (×2): 80 ug via INTRAVENOUS

## 2015-01-10 MED ORDER — MIDAZOLAM HCL 2 MG/2ML IJ SOLN
INTRAMUSCULAR | Status: DC | PRN
  Administered 2015-01-10: 2 mg via INTRAVENOUS

## 2015-01-10 MED ORDER — SODIUM CHLORIDE 0.9 % IJ SOLN: 3.0000 mL | INTRAMUSCULAR | Status: DC | PRN

## 2015-01-10 MED ORDER — MISOPROSTOL 100 MCG PO TABS
ORAL_TABLET | ORAL | Status: DC | PRN
  Administered 2015-01-10: 800 ug via RECTAL

## 2015-01-10 MED ORDER — PROPOFOL 10 MG/ML IV BOLUS
INTRAVENOUS | Status: DC | PRN
  Administered 2015-01-10: 20 mg via INTRAVENOUS
  Administered 2015-01-10: 50 mg via INTRAVENOUS
  Administered 2015-01-10: 30 mg via INTRAVENOUS
  Administered 2015-01-10: 20 mg via INTRAVENOUS
  Administered 2015-01-10: 30 mg via INTRAVENOUS
  Administered 2015-01-10: 50 mg via INTRAVENOUS

## 2015-01-10 MED ORDER — SODIUM CHLORIDE 0.9 % IJ SOLN
3.0000 mL | Freq: Two times a day (BID) | INTRAMUSCULAR | Status: DC
  Administered 2015-01-10 – 2015-01-11 (×2): 3 mL via INTRAVENOUS

## 2015-01-10 MED ORDER — MISOPROSTOL 200 MCG PO TABS
ORAL_TABLET | ORAL | Status: AC
  Filled 2015-01-10: qty 4

## 2015-01-10 MED ORDER — CITRIC ACID-SODIUM CITRATE 334-500 MG/5ML PO SOLN
ORAL | Status: AC
  Administered 2015-01-10: 30 mL
  Filled 2015-01-10: qty 15

## 2015-01-10 MED ORDER — PHENYLEPHRINE 8 MG IN D5W 100 ML (0.08MG/ML) PREMIX OPTIME
10.0000 ug/min | INJECTION | Freq: Once | INTRAVENOUS | Status: DC
  Filled 2015-01-10: qty 100

## 2015-01-10 MED ORDER — LACTATED RINGERS IV SOLN
INTRAVENOUS | Status: DC | PRN
  Administered 2015-01-10 (×2): via INTRAVENOUS

## 2015-01-10 MED ORDER — FENTANYL CITRATE (PF) 100 MCG/2ML IJ SOLN
INTRAMUSCULAR | Status: AC
  Filled 2015-01-10: qty 4

## 2015-01-10 MED ORDER — MIDAZOLAM HCL 2 MG/2ML IJ SOLN
INTRAMUSCULAR | Status: AC
  Filled 2015-01-10: qty 4

## 2015-01-10 MED ORDER — 0.9 % SODIUM CHLORIDE (POUR BTL) OPTIME
TOPICAL | Status: DC | PRN
Start: 2015-01-10 — End: 2015-01-10
  Administered 2015-01-10: 1000 mL

## 2015-01-10 MED ORDER — FENTANYL CITRATE (PF) 100 MCG/2ML IJ SOLN
INTRAMUSCULAR | Status: DC | PRN
  Administered 2015-01-10 (×2): 50 ug via INTRAVENOUS

## 2015-01-10 MED ORDER — OXYTOCIN 40 UNITS IN LACTATED RINGERS INFUSION - SIMPLE MED
INTRAVENOUS | Status: AC
  Filled 2015-01-10: qty 1000

## 2015-01-10 MED ORDER — LIDOCAINE HCL (PF) 1 % IJ SOLN
INTRAMUSCULAR | Status: AC
  Filled 2015-01-10: qty 30

## 2015-01-10 SURGICAL SUPPLY — 21 items
CATH ROBINSON RED A/P 16FR (CATHETERS) ×3 IMPLANT
CLOTH BEACON ORANGE TIMEOUT ST (SAFETY) ×3 IMPLANT
DECANTER SPIKE VIAL GLASS SM (MISCELLANEOUS) ×3 IMPLANT
GLOVE BIO SURGEON STRL SZ 6.5 (GLOVE) ×2 IMPLANT
GLOVE BIO SURGEON STRL SZ7 (GLOVE) ×3 IMPLANT
GLOVE BIO SURGEONS STRL SZ 6.5 (GLOVE) ×1
GLOVE BIOGEL PI IND STRL 7.0 (GLOVE) ×2 IMPLANT
GLOVE BIOGEL PI INDICATOR 7.0 (GLOVE) ×4
GOWN STRL REUS W/TWL LRG LVL3 (GOWN DISPOSABLE) ×6 IMPLANT
KIT BERKELEY 1ST TRIMESTER 3/8 (MISCELLANEOUS) ×3 IMPLANT
NS IRRIG 1000ML POUR BTL (IV SOLUTION) ×3 IMPLANT
PACK VAGINAL MINOR WOMEN LF (CUSTOM PROCEDURE TRAY) ×3 IMPLANT
PAD OB MATERNITY 4.3X12.25 (PERSONAL CARE ITEMS) ×3 IMPLANT
PAD PREP 24X48 CUFFED NSTRL (MISCELLANEOUS) ×3 IMPLANT
SET BERKELEY SUCTION TUBING (SUCTIONS) ×3 IMPLANT
TOWEL OR 17X24 6PK STRL BLUE (TOWEL DISPOSABLE) ×6 IMPLANT
VACURETTE 10 RIGID CVD (CANNULA) IMPLANT
VACURETTE 12 RIGID CVD (CANNULA) ×3 IMPLANT
VACURETTE 7MM CVD STRL WRAP (CANNULA) IMPLANT
VACURETTE 8 RIGID CVD (CANNULA) IMPLANT
VACURETTE 9 RIGID CVD (CANNULA) IMPLANT

## 2015-01-10 NOTE — Progress Notes (Signed)
Spoke with Dr. Maple Hudson regarding pts cbc and status at present.  Will hold off blood transfusion for now and turn off neosynephrine drip. Will continue to monitor closely.

## 2015-01-10 NOTE — Brief Op Note (Signed)
01/07/2015 - 01/10/2015  10:46 PM  PATIENT:  Brianna Hall  39 y.o. female  PRE-OPERATIVE DIAGNOSIS:  Retained Placenta, cerclage stitch intact  POST-OPERATIVE DIAGNOSIS:  Retained Placenta, cerclage removed  PROCEDURE:  Procedure(s): DILATATION AND EVACUATION with cerclage removal  (N/A)  SURGEON:  Surgeon(s) and Role:    * Jody Bovard-Stuckert, MD - Primary  ANESTHESIA:   IV sedation  EBL:  Total I/O In: 1000 [I.V.:1000] Out: 1454 [Blood:1454]  BLOOD ADMINISTERED:none  DRAINS: Urinary Catheter (Foley)   LOCAL MEDICATIONS USED:  NONE  SPECIMEN:  Source of Specimen:  Placenta and EMC  DISPOSITION OF SPECIMEN:  PATHOLOGY  COUNTS:  YES  TOURNIQUET:  * No tourniquets in log *  DICTATION: .Other Dictation: Dictation Number [id number]    PLAN OF CARE: transfer to gyn  PATIENT DISPOSITION:  PACU - hemodynamically stable.   Delay start of Pharmacological VTE agent (>24hrs) due to surgical blood loss or risk of bleeding: not applicable

## 2015-01-10 NOTE — Transfer of Care (Signed)
Immediate Anesthesia Transfer of Care Note  Patient: Brianna Hall  Procedure(s) Performed: Procedure(s): DILATATION AND EVACUATION with cerclage removal  (N/A)  Patient Location: PACU  Anesthesia Type:MAC  Level of Consciousness: sedated    Airway & Oxygen Therapy: Patient Spontanous Breathing and Patient connected to nasal cannula oxygen  Post-op Assessment: Report given to RN  Post vital signs: Reviewed and stable  Last Vitals:  Filed Vitals:   01/10/15 2141  BP: 74/42  Pulse: 82  Temp:   Resp:     Complications: No apparent anesthesia complications

## 2015-01-10 NOTE — Progress Notes (Signed)
Patient ID: Brianna Hall, female   DOB: 02/14/1976, 39 y.o.   MRN: [id number]  [place] - to confirm possible ROM Definite ROM, baby in vagina to chest, still FHTs 200's, cervix dilated to 4 cm, baby is breech  AFVSS gen upset, states some cramping earlier, but better;  Some increased bleeding earlier, but better  D/W pt and husband clinical situation - need to clip cerclage Baby not viable, no way to save pregnancy  Briefly spoke about prophylactic cerclage in future Briefly mentioned [facility]  Parents given moment to compose themselves will transfer to L&D.  Questions answered.

## 2015-01-10 NOTE — Progress Notes (Signed)
Patient ID: Brianna Hall, female   DOB: 1976-04-07, 39 y.o.   MRN: [id number] Pt sleeping comfortably but easily arousable. Denies any cramping/contractions, or leakage of fluid. Still having vaginal spotting. Received 17P injection yesterday. Denies any fever or chills. Wants to know when can be discharged to home. Happy was able to take shower last night - helped her feel better. No further chest pain   WBC 13.6 this am +FHTs No contractions  A/P : 19wks 2 days ga with incompetent cervix, s/p cerclage- no further contractions and afebrile          Spoke with MFM ( Dr Harlon Flor) and agreed on the following plan:           Will stop antibiotics at this time. If chorioamnionitis present would not want to mask it any further and since wbc essentially normal range now it is safe to discontinue          Will also stop indomethacin since has now received x 72hrs          Will use procardia for contractions intermittently based on BP ( hold if sbp<90)          Will stop vaginal progesterone as pt received 17P yesterday          Will watch pt over the weekend and if stable over weekend will consider placing a pessary- indeterminate if will help but won't hurt          Continue with monitoring q 8hrs

## 2015-01-10 NOTE — Progress Notes (Signed)
MD notified of pt VB episode, no pain. No orders received.

## 2015-01-10 NOTE — Progress Notes (Signed)
Introduced spiritual care services to Ms Brianna Hall, a [place] woman who recently moved to [place] from [place].  She's 19 weeks as of Wednesday and has to be flat on her back.  Her little boy, Cal, turned 6 today so she has a lot of guilt about that.  She is also raising a 41 year old "student" from [place] who has been living with her during the school year for 2 years.  She's somewhat tearful and trying to get to a place of acceptance about her need for bed rest for her baby's safety, but really missing home.  She stated that she "trusts God" and knows that theres a plan     01/10/15 1508  Clinical Encounter Type  Visited With Patient  Visit Type Initial;Spiritual support  Spiritual Encounters  Spiritual Needs Emotional  Stress Factors  Patient Stress Factors Family relationships;Loss of control;Major life changes

## 2015-01-10 NOTE — Progress Notes (Signed)
Less vaginal bleeding noted and increased pink fluid saturating the peripad

## 2015-01-10 NOTE — Progress Notes (Signed)
LR 500cc bolus started. Labs ordered and drawn. Multiple Attempts to start additional IV site.

## 2015-01-10 NOTE — Progress Notes (Signed)
Pt stating that she changed her pad one hour ago, pad noted in bathroom, saturated with red blood and a stringy clot. Pt denies pain, current pad that she has had on in the past hour is significantly less, and looks more pink than red.

## 2015-01-10 NOTE — Anesthesia Preprocedure Evaluation (Addendum)
Anesthesia Evaluation  Patient identified by MRN, date of birth, ID band Patient awake  Preop documentation limited or incomplete due to emergent nature of procedure.  Airway Mallampati: III  TM Distance: <3 FB Neck ROM: Full  Mouth opening: Limited Mouth Opening  Dental no notable dental hx. (+) Teeth Intact   Pulmonary    breath sounds clear to auscultation       Cardiovascular Normal cardiovascular exam Rhythm:Regular Rate:Normal     Neuro/Psych    GI/Hepatic   Endo/Other    Renal/GU      Musculoskeletal   Abdominal   Peds  Hematology   Anesthesia Other Findings   Reproductive/Obstetrics                             Anesthesia Physical Anesthesia Plan  ASA: II and emergent  Anesthesia Plan: MAC   Post-op Pain Management:    Induction: Intravenous  Airway Management Planned: Nasal Cannula  Additional Equipment: None  Intra-op Plan:   Post-operative Plan:   Informed Consent:   Only emergency history available  Plan Discussed with: Anesthesiologist, Surgeon and CRNA  Anesthesia Plan Comments:        Anesthesia Quick Evaluation

## 2015-01-10 NOTE — Progress Notes (Signed)
Pt in room 170. Report given to Tama Headings, RN.

## 2015-01-11 LAB — CBC
HCT: 19.9 % — ABNORMAL LOW (ref 36.0–46.0)
HEMOGLOBIN: 6.7 g/dL — AB (ref 12.0–15.0)
MCH: 31.2 pg (ref 26.0–34.0)
MCHC: 33.7 g/dL (ref 30.0–36.0)
MCV: 92.6 fL (ref 78.0–100.0)
PLATELETS: 207 10*3/uL (ref 150–400)
RBC: 2.15 MIL/uL — AB (ref 3.87–5.11)
RDW: 13.2 % (ref 11.5–15.5)
WBC: 16.6 10*3/uL — AB (ref 4.0–10.5)

## 2015-01-11 MED ORDER — DEXTROSE 5 % IV SOLN
2.0000 g | Freq: Two times a day (BID) | INTRAVENOUS | Status: AC
  Administered 2015-01-11 (×2): 2 g via INTRAVENOUS
  Filled 2015-01-11 (×2): qty 2

## 2015-01-11 MED ORDER — ZOLPIDEM TARTRATE 5 MG PO TABS: 5.0000 mg | ORAL_TABLET | Freq: Every evening | ORAL | Status: DC | PRN

## 2015-01-11 MED ORDER — ACETAMINOPHEN 160 MG/5ML PO SOLN
325.0000 mg | ORAL | Status: DC | PRN
Start: 2015-01-11 — End: 2015-01-11

## 2015-01-11 MED ORDER — ACETAMINOPHEN 325 MG PO TABS: 650.0000 mg | ORAL_TABLET | ORAL | Status: DC | PRN

## 2015-01-11 MED ORDER — SIMETHICONE 80 MG PO CHEW: 80.0000 mg | CHEWABLE_TABLET | ORAL | Status: DC | PRN

## 2015-01-11 MED ORDER — BENZOCAINE-MENTHOL 20-0.5 % EX AERO: 1.0000 "application " | INHALATION_SPRAY | CUTANEOUS | Status: DC | PRN

## 2015-01-11 MED ORDER — WITCH HAZEL-GLYCERIN EX PADS: 1.0000 "application " | MEDICATED_PAD | CUTANEOUS | Status: DC | PRN

## 2015-01-11 MED ORDER — DIPHENHYDRAMINE HCL 25 MG PO CAPS: 25.0000 mg | ORAL_CAPSULE | Freq: Four times a day (QID) | ORAL | Status: DC | PRN

## 2015-01-11 MED ORDER — IBUPROFEN 600 MG PO TABS
600.0000 mg | ORAL_TABLET | Freq: Four times a day (QID) | ORAL | Status: DC
  Administered 2015-01-11 (×2): 600 mg via ORAL
  Filled 2015-01-11 (×2): qty 1

## 2015-01-11 MED ORDER — PRENATAL MULTIVITAMIN CH
1.0000 | ORAL_TABLET | Freq: Every day | ORAL | Status: DC
  Administered 2015-01-11: 1 via ORAL
  Filled 2015-01-11: qty 1

## 2015-01-11 MED ORDER — LACTATED RINGERS IV SOLN
INTRAVENOUS | Status: DC
  Administered 2015-01-11 (×2): via INTRAVENOUS

## 2015-01-11 MED ORDER — DIBUCAINE 1 % RE OINT: 1.0000 "application " | TOPICAL_OINTMENT | RECTAL | Status: DC | PRN

## 2015-01-11 MED ORDER — OXYCODONE-ACETAMINOPHEN 5-325 MG PO TABS: 1.0000 | ORAL_TABLET | ORAL | Status: DC | PRN

## 2015-01-11 MED ORDER — LANOLIN HYDROUS EX OINT: TOPICAL_OINTMENT | CUTANEOUS | Status: DC | PRN

## 2015-01-11 MED ORDER — FENTANYL CITRATE (PF) 100 MCG/2ML IJ SOLN: 25.0000 ug | INTRAMUSCULAR | Status: DC | PRN

## 2015-01-11 MED ORDER — ONDANSETRON HCL 4 MG PO TABS: 4.0000 mg | ORAL_TABLET | ORAL | Status: DC | PRN

## 2015-01-11 MED ORDER — SENNOSIDES-DOCUSATE SODIUM 8.6-50 MG PO TABS: 2.0000 | ORAL_TABLET | ORAL | Status: DC

## 2015-01-11 MED ORDER — OXYCODONE-ACETAMINOPHEN 5-325 MG PO TABS: 2.0000 | ORAL_TABLET | ORAL | Status: DC | PRN

## 2015-01-11 MED ORDER — ONDANSETRON HCL 4 MG/2ML IJ SOLN: 4.0000 mg | INTRAMUSCULAR | Status: DC | PRN

## 2015-01-11 MED ORDER — ACETAMINOPHEN 325 MG PO TABS
325.0000 mg | ORAL_TABLET | ORAL | Status: DC | PRN
Start: 2015-01-11 — End: 2015-01-11

## 2015-01-11 NOTE — Addendum Note (Signed)
Addendum  created 01/11/15 0025 by Val Eagle, MD   Modules edited: Anesthesia LDA, Lines/Drains/Airways Properties Editor   Lines/Drains/Airways Properties Editor:  Properties of line/drain/airway/wound Peripheral IV 01/10/15 Left Antecubital have been modified.

## 2015-01-11 NOTE — Progress Notes (Signed)
Patient ID: Brianna Hall, female   DOB: Jul 17, 1975, 39 y.o.   MRN: [id number]  PPD#1/POD #1 - SVD 19wk, D&E for retained placenta  Orthostatic, tired.  Crampy, but pain controlled  AFVSS gen NAD Abd soft, app tender   38yo s/p SVD and D&E, doing reasonably well.  Immediate postop Hgb 8.0, now 6.7 and symptomatic Will d/w pt r/b/a of blood transfusion - declines for now Cefotetan for prophylaxis

## 2015-01-11 NOTE — Progress Notes (Signed)
CRITICAL VALUE ALERT  Critical value received: 6.7  Date of notification:  01/11/15  Time of notification:  0615  Critical value read back:Yes.    Nurse who received alert:  MaggieLathamRN  MD notified (1st page):  Dr. Ellyn Hack  Time of first page:  0620  MD notified (2nd page):  Time of second page:  Responding MD:  Dr. Ellyn Hack  Time MD responded:  [phone]

## 2015-01-11 NOTE — Clinical Documentation Improvement (Signed)
OB/GYN  Abnormal Lab/Test Results:  01/11/15 Hgb= 6.7  Possible Clinical Conditions associated with below indicators  Acute Blood Loss Anemia  Chronic anemia  Other Condition  Cannot Clinically Determine   Supporting Information: 01/10/15 Hgb= 9.3, 9.4, 8.0 EBL= 1500 ml- delivery and surgery   Please exercise your independent, professional judgment when responding. A specific answer is not anticipated or expected.   Thank You,  Cherylann Ratel Health Information Management [place] [phone]

## 2015-01-11 NOTE — Anesthesia Postprocedure Evaluation (Signed)
  Anesthesia Post-op Note  Patient: Brianna Hall  Procedure(s) Performed: Procedure(s): DILATATION AND EVACUATION with cerclage removal  (N/A)  Patient Location: PACU  Anesthesia Type:MAC  Level of Consciousness: awake, alert  and oriented  Airway and Oxygen Therapy: Patient Spontanous Breathing  Post-op Pain: mild  Post-op Assessment: Post-op Vital signs reviewed, Patient's Cardiovascular Status Stable, Respiratory Function Stable, Patent Airway, No signs of Nausea or vomiting and Pain level controlled LLE Motor Response: Purposeful movement LLE Sensation: Tingling RLE Motor Response: Purposeful movement RLE Sensation: Tingling L Sensory Level: S1-Sole of foot, small toes R Sensory Level: S1-Sole of foot, small toes  Post-op Vital Signs: Reviewed and stable  Last Vitals:  Filed Vitals:   01/10/15 2330  BP: 102/55  Pulse: 80  Temp:   Resp: 23    Complications: No apparent anesthesia complications

## 2015-01-11 NOTE — Progress Notes (Signed)
Acknowledged MD order for support due to loss of pre-term baby.   Spoke with both parents.  Acknowledged their loss.  The couple have been married for six years.  Parents states that they has experienced the loss of several relatives this year. They are concerned about their six y66ear old son, and how to explain the loss to him.  Offered suggestions and provided  information on talking to children about death.   They both seem very supportive of each other.  Father was very attentive to mother.  Encouraged continued support for each other.  Provided them with support group information and they were receptive.  Father has access to EAP and states that he has used it in the past.   Informed them of social work Surveyor, mining.

## 2015-01-11 NOTE — Op Note (Signed)
[name], Brianna Hall                   ACCOUNT NO.:  [id number]  MEDICAL RECORD NO.:  [id number]  LOCATION:  9318                          FACILITY:  [facility]  PHYSICIAN:  Sherron Monday, MD        DATE OF BIRTH:  1975/10/04  DATE OF PROCEDURE:  01/10/2015 DATE OF DISCHARGE:                              OPERATIVE REPORT   PREOPERATIVE DIAGNOSIS:  Status post spontaneous vaginal delivery of 43- week infant with retained placenta and a cerclage stitch intact.  POSTOPERATIVE DIAGNOSIS:  Status post spontaneous vaginal delivery of 19- week infant with retained placenta and a cerclage stitch intact, status post D and C and cerclage removal.  SURGEON:  Sherron Monday, MD  PROCEDURE:  D and E with cerclage removal.  ANESTHESIA:  IV sedation.  EBL:  Approximately 1500 mL from delivery and procedure.  IV FLUIDS:  100 mL.  URINE OUTPUT:  Foley catheter placed.  SPECIMEN:  Placenta and endometrial curettings to Pathology.  COMPLICATIONS:  None.  DESCRIPTION OF PROCEDURE:  After informed consent was reviewed with the patient and her spouse, she was taken to the operating room, placed on the table in supine position.  IV sedation was induced and found to be adequate.  She was then placed in the [place] stirrups.  An open-sided speculum was used to visualize the cerclage, it was grasped with a sponge stick and clip to remove it in entirety.  The placenta was then manually extracted.  Due to the large amount of bleeding after manual Instruction, a suction curettage was performed using a 12-French curette to clean inside of the uterus, it was also cleaned with the [place] curette.  The patient had large amount of bleeding.  She was given 800 mg of rectal Cytotec, secondary IV had also been placed.  In the PACU, a CBC will be checked and decision will be made if necessary to proceed with a transfusion.  The patient tolerated the procedure well.  Sponge, lap, and needle counts were correct x2.   Instruments were removed from her vagina.  A single-tooth tenaculum had been placed on the posterior lip of her cervix.  She tolerated the procedure well and was awakened in stable condition.     Sherron Monday, MD     JB/MEDQ  D:  01/10/2015  T:  01/11/2015  Job:  [id number]

## 2015-01-12 LAB — CBC
HCT: 18.7 % — ABNORMAL LOW (ref 36.0–46.0)
Hemoglobin: 6.4 g/dL — CL (ref 12.0–15.0)
MCH: 31.8 pg (ref 26.0–34.0)
MCHC: 34.2 g/dL (ref 30.0–36.0)
MCV: 93 fL (ref 78.0–100.0)
PLATELETS: 188 10*3/uL (ref 150–400)
RBC: 2.01 MIL/uL — ABNORMAL LOW (ref 3.87–5.11)
RDW: 13.2 % (ref 11.5–15.5)
WBC: 6.8 10*3/uL (ref 4.0–10.5)

## 2015-01-12 MED ORDER — OXYCODONE-ACETAMINOPHEN 5-325 MG PO TABS: 1.0000 | ORAL_TABLET | Freq: Four times a day (QID) | ORAL | Status: DC | PRN

## 2015-01-12 MED ORDER — FERROUS SULFATE 325 (65 FE) MG PO TABS: 325.0000 mg | ORAL_TABLET | Freq: Two times a day (BID) | ORAL | Status: AC

## 2015-01-12 MED ORDER — IBUPROFEN 800 MG PO TABS: 800.0000 mg | ORAL_TABLET | Freq: Three times a day (TID) | ORAL | Status: DC | PRN

## 2015-01-12 NOTE — Progress Notes (Signed)
Post Partum Day 2 Subjective: no complaints, up ad lib, voiding, tolerating PO and nl lochia, pain controlled.  Received antibiotics for uterine instrumentation and large EBL - PP Hmg.  Pt with blood loss anemia.  Declines transfusion. Has been able to ambulate w/o sx's.  Voiding well  Objective: Blood pressure 99/54, pulse 73, temperature 97.9 F (36.6 C), temperature source Oral, resp. rate 16, height  (1.549 m), weight 71.668 kg (158 lb), last menstrual period 08/31/2014, SpO2 98 %, unknown if currently breastfeeding.  Physical Exam:  General: alert and no distress Lochia: appropriate Uterine Fundus: firm   Recent Labs  01/10/15 2255 01/11/15 0505  HGB 8.0* 6.7*  HCT 23.4* 19.9*    Assessment/Plan: Discharge home.  SW has seen pt,  Will have f/u at office   LOS: 5 days   Bovard-Stuckert, Brianna Hall 01/12/2015, 5:20 AM

## 2015-01-12 NOTE — Progress Notes (Signed)
Discharge instructions reviewed with patient.  Patient states understanding of home care, medications, activity, signs/symptoms to report to MD and return MD office visit.  Patients significant other and family will assist with her care @ home.  No home  equipment needed, patient has prescriptions and all personal belongings.  Patient discharged per wheelchair in stable condition with staff without incident.  

## 2015-01-12 NOTE — Discharge Summary (Signed)
Obstetric Discharge Summary Reason for Admission: incompetent cervix, s/p cerclage Prenatal Procedures: none Intrapartum Procedures: spontaneous vaginal delivery and D&C, cerclage removal Postpartum Procedures: none Complications-Operative and Postpartum: hemorrhage HEMOGLOBIN  Date Value Ref Range Status  01/11/2015 6.7* 12.0 - 15.0 g/dL Final    Comment:    REPEATED TO VERIFY CRITICAL RESULT CALLED TO, READ BACK BY AND VERIFIED WITH: MAGGIE LATHAM RN.  ON 10.1.2016 BY TCALDWELL    HCT  Date Value Ref Range Status  01/11/2015 19.9* 36.0 - 46.0 % Final    Physical Exam:  General: alert and no distress Lochia: appropriate Uterine Fundus: firm  Discharge Diagnoses: Incompetent cervix and PP Hmg, D&C (retained placenta) removal of cerclage  Discharge Information: Date: 01/12/2015 Activity: pelvic rest Diet: routine Medications: Ibuprofen, Percocet and vitamins and iron Condition: stable Instructions: refer to practice specific booklet Discharge to: home Follow-up Information    Follow up with Sharol Given Banga, DO. Schedule an appointment as soon as possible for a visit in 2 weeks.   Specialty:  Obstetrics and Gynecology   Why:  or [facility]; Call [phone] with quesrtions or problems   Contact information:   [address] [place] [address] [place] [address] [phone]       Newborn Data: Live born female  Birth Weight: 9.1 oz (258 g) APGAR: 1, 1  Brianna Hall  Bovard-Stuckert, Dennette Faulconer 01/12/2015, 5:27 AM

## 2015-01-12 NOTE — Progress Notes (Signed)
Referred by staff. Both pt and husband acknowledge their profound grief over the death of their child. Their marriage seems deeply rooted in love and devotion to one another, and does not seem strained by the event of the death of a child. The husband is attentive to his wife's needs and wishes. Both wish to have the child's body buried on family land in [place]. Nurses are providing information on the procedures and regulations requirements to do such.   Concern is focused on how to tell their 86-year-old son and the 10 year-old exchange student from [place] that lives with them about the death. The concern is that the exchange student will feel it is his fault that the child died because he argues with the pt a great deal and stresses her. The son is just starting school and worry is that he will be distracted by the event so early in the school year. Recommended [facility] and other child counseling programs.  Pt is a person of [employer] faith who is active in her community of faith. Her views are influenced by there faith and are the lens in which she sees life. She has support from this community of faith and from friends. Chaplain provided grief counsel and support, as well as prayers.  Couple advised of the availability of Spiritual Care services at any time. Please page the chaplain if pt or family needs and/or desires further support.  Benjie Karvonen. Ermagene Saidi, DMin, MDiv Chaplain

## 2015-01-13 ENCOUNTER — Encounter (HOSPITAL_COMMUNITY): Payer: Self-pay | Admitting: Obstetrics and Gynecology

## 2015-01-13 NOTE — Addendum Note (Signed)
Addendum  created 01/13/15 1640 by Randa Spike, CRNA   Modules edited: Charges VN

## 2015-01-14 LAB — TYPE AND SCREEN
ABO/RH(D): B POS
ANTIBODY SCREEN: NEGATIVE
Unit division: 0
Unit division: 0

## 2015-08-25 LAB — OB RESULTS CONSOLE HEPATITIS B SURFACE ANTIGEN: HEP B S AG: NEGATIVE

## 2015-08-25 LAB — OB RESULTS CONSOLE RUBELLA ANTIBODY, IGM: RUBELLA: IMMUNE

## 2015-08-25 LAB — OB RESULTS CONSOLE HIV ANTIBODY (ROUTINE TESTING): HIV: NONREACTIVE

## 2015-08-25 LAB — OB RESULTS CONSOLE GC/CHLAMYDIA
CHLAMYDIA, DNA PROBE: NEGATIVE
GC PROBE AMP, GENITAL: NEGATIVE

## 2015-08-25 LAB — OB RESULTS CONSOLE RPR: RPR: NONREACTIVE

## 2015-08-29 ENCOUNTER — Inpatient Hospital Stay (HOSPITAL_COMMUNITY)
Admission: AD | Admit: 2015-08-29 | Discharge: 2015-08-30 | Disposition: A | Discharge status: Home / Self Care | Payer: 59 | Source: Ambulatory Visit | Attending: Obstetrics and Gynecology | Admitting: Obstetrics and Gynecology

## 2015-08-29 ENCOUNTER — Encounter (HOSPITAL_COMMUNITY): Payer: Self-pay

## 2015-08-29 ENCOUNTER — Inpatient Hospital Stay (HOSPITAL_COMMUNITY): Payer: 59

## 2015-08-29 DIAGNOSIS — Z3A09 9 weeks gestation of pregnancy: Secondary | ICD-10-CM | POA: Diagnosis not present

## 2015-08-29 DIAGNOSIS — O209 Hemorrhage in early pregnancy, unspecified: Secondary | ICD-10-CM | POA: Diagnosis not present

## 2015-08-29 DIAGNOSIS — R58 Hemorrhage, not elsewhere classified: Secondary | ICD-10-CM

## 2015-08-29 DIAGNOSIS — O4691 Antepartum hemorrhage, unspecified, first trimester: Secondary | ICD-10-CM

## 2015-08-29 HISTORY — DX: Gestational diabetes mellitus in pregnancy, unspecified control: O24.419

## 2015-08-29 HISTORY — DX: Anemia, unspecified: D64.9

## 2015-08-29 LAB — URINALYSIS, ROUTINE W REFLEX MICROSCOPIC
BILIRUBIN URINE: NEGATIVE
Glucose, UA: NEGATIVE mg/dL
Ketones, ur: NEGATIVE mg/dL
Leukocytes, UA: NEGATIVE
NITRITE: NEGATIVE
PH: 5.5 (ref 5.0–8.0)
Protein, ur: NEGATIVE mg/dL

## 2015-08-29 LAB — URINE MICROSCOPIC-ADD ON: WBC UA: NONE SEEN WBC/hpf (ref 0–5)

## 2015-08-29 NOTE — MAU Note (Signed)
Pink spotting for 2 days when voided.  Increased vaginal bleeding started at 8:36 pm.  It soaked through underwear. Only pinkish now when wiped in bathroom.  Cramps on and off but only little bit now with pressure.  Ultrasound at office last Monday.

## 2015-08-29 NOTE — MAU Provider Note (Signed)
History     CSN: [id number]  Arrival date and time: 08/29/15 2143   First Provider Initiated Contact with Patient 08/29/15 2222      Chief Complaint  Patient presents with  . Vaginal Bleeding   HPI  Brianna Hall 40 y.o. G3P1001 @ [redacted]w[redacted]d presents with vaginal bleeding that occurred at approximately 830pm. States it soaked through her underwear. States she is feeling pressure in her lower abdomen.   Past Medical History  Diagnosis Date  . Medical history non-contributory   . SVD (spontaneous vaginal delivery) 01/10/2015  . S/P dilation and curettage 01/10/2015  . Anemia   . Gestational diabetes     Past Surgical History  Procedure Laterality Date  . Cholecystectomy    . Cervical cerclage N/A 01/02/2015    Procedure: CERCLAGE CERVICAL;  Surgeon: Edwinna Areola, DO;  Location: [facility];  Service: Gynecology;  Laterality: N/A;  . Cesarean section    . Cholecystectomy    . Dilation and evacuation N/A 01/10/2015    Procedure: DILATATION AND EVACUATION with cerclage removal ;  Surgeon: Sherian Rein, MD;  Location: [facility];  Service: Gynecology;  Laterality: N/A;    Family History  Problem Relation Age of Onset  . Heart disease Father     Social History  Substance Use Topics  . Smoking status: Never Smoker   . Smokeless tobacco: None  . Alcohol Use: No    Allergies: No Known Allergies  Prescriptions prior to admission  Medication Sig Dispense Refill Last Dose  . ferrous sulfate (FERROUSUL) 325 (65 FE) MG tablet Take 1 tablet (325 mg total) by mouth 2 (two) times daily with a meal. (Patient taking differently: Take 325 mg by mouth 2 (two) times a week. ) 30 tablet 3 Past Week at Unknown time  . Flaxseed, Linseed, (FLAXSEED OIL PO) Take 1,400 mg by mouth once a week.   Past Month at Unknown time  . Omega-3 Fatty Acids (FISH OIL PO) Take 1 capsule by mouth daily.   Past Month at Unknown time  . Prenatal Vit-Fe Fumarate-FA (PRENATAL MULTIVITAMIN) TABS tablet Take 1  tablet by mouth daily at 12 noon.   08/28/2015 at Unknown time    Review of Systems  Genitourinary:       Heavy vaginal bleeding  All other systems reviewed and are negative.  Physical Exam   Blood pressure 95/52, pulse 74, temperature 98.6 F (37 C), temperature source Oral, resp. rate 16, last menstrual period 08/31/2014, SpO2 98 %, not currently breastfeeding.  Physical Exam  Nursing note and vitals reviewed. Constitutional: She is oriented to person, place, and time. She appears well-developed and well-nourished.  HENT:  Head: Normocephalic and atraumatic.  Neck: Normal range of motion.  Cardiovascular: Normal rate and regular rhythm.   Respiratory: Effort normal and breath sounds normal. No respiratory distress.  GI: Soft. There is no tenderness.  Genitourinary: Vaginal discharge found.  bloody  Musculoskeletal: Normal range of motion.  Neurological: She is alert and oriented to person, place, and time.  Skin: Skin is warm and dry.  Psychiatric: She has a normal mood and affect. Her behavior is normal. Judgment and thought content normal.   US Ob Comp Less 14 Wks  08/29/2015  CLINICAL DATA:  Pelvic bleeding. LMP is not recorded. Quantitative beta HCG is not ordered. EXAM: OBSTETRIC <14 WK ULTRASOUND TECHNIQUE: Transabdominal ultrasound was performed for evaluation of the gestation as well as the maternal uterus and adnexal regions. COMPARISON:  None. FINDINGS: Intrauterine gestational  sac: A single intrauterine pregnancy is present. Yolk sac:  Yolk sac is present. Embryo:  Fetal pole is present. Cardiac Activity: Fetal cardiac activity is observed. Heart Rate: 173 bpm CRL:   26.3  mm   9 w 3 d                  US EDC: 03/30/2016 Subchorionic hemorrhage:  None identified. Maternal uterus/adnexae: No myometrial mass lesions identified. Cervix is not visualized. Both ovaries are identified with limited visualization. No abnormal adnexal masses. Normal follicular changes seen. No free  fluid in the pelvis. IMPRESSION: Single intrauterine pregnancy. Estimated gestational age by crown-rump length is 9 weeks 3 days. No acute complications indicated. Electronically Signed   By: Burman NievesWilliam  Stevens M.D.   On: 08/29/2015 23:43    MAU Course  Procedures  MDM Ultrasound shows positive fetal heart tones and no subchorionic hemorrhage. Reassured pt and told them to return to MAU if further bleeding occurs.  Assessment and Plan  Vaginal bleeding in first trimester  Discharge  Andi Layfield Grissett 08/29/2015, 10:24 PM

## 2015-08-29 NOTE — Discharge Instructions (Signed)
Vaginal Bleeding During Pregnancy, First Trimester °A small amount of bleeding (spotting) from the vagina is common in early pregnancy. Sometimes the bleeding is normal and is not a problem, and sometimes it is a sign of something serious. Be sure to tell your doctor about any bleeding from your vagina right away. °HOME CARE °· Watch your condition for any changes. °· Follow your doctor's instructions about how active you can be. °· If you are on bed rest: °¨ You may need to stay in bed and only get up to use the bathroom. °¨ You may be allowed to do some activities. °¨ If you need help, make plans for someone to help you. °· Write down: °¨ The number of pads you use each day. °¨ How often you change pads. °¨ How soaked (saturated) your pads are. °· Do not use tampons. °· Do not douche. °· Do not have sex or orgasms until your doctor says it is okay. °· If you pass any tissue from your vagina, save the tissue so you can show it to your doctor. °· Only take medicines as told by your doctor. °· Do not take aspirin because it can make you bleed. °· Keep all follow-up visits as told by your doctor. °GET HELP IF:  °· You bleed from your vagina. °· You have cramps. °· You have labor pains. °· You have a fever that does not go away after you take medicine. °GET HELP RIGHT AWAY IF:  °· You have very bad cramps in your back or belly (abdomen). °· You pass large clots or tissue from your vagina. °· You bleed more. °· You feel light-headed or weak. °· You pass out (faint). °· You have chills. °· You are leaking fluid or have a gush of fluid from your vagina. °· You pass out while pooping (having a bowel movement). °MAKE SURE YOU: °· Understand these instructions. °· Will watch your condition. °· Will get help right away if you are not doing well or get worse. °  °This information is not intended to replace advice given to you by your health care provider. Make sure you discuss any questions you have with your health care  provider. °  °Document Released: 08/13/2013 Document Reviewed: 08/13/2013 °Elsevier Interactive Patient Education ©2016 [employer] ° °Pelvic Rest °Pelvic rest is sometimes recommended for women when:  °· The placenta is partially or completely covering the opening of the cervix (placenta previa). °· There is bleeding between the uterine wall and the amniotic sac in the first trimester (subchorionic hemorrhage). °· The cervix begins to open without labor starting (incompetent cervix, cervical insufficiency). °· The labor is too early (preterm labor). °HOME CARE INSTRUCTIONS °· Do not have sexual intercourse, stimulation, or an orgasm. °· Do not use tampons, douche, or put anything in the vagina. °· Do not lift anything over 10 pounds (4.5 kg). °· Avoid strenuous activity or straining your pelvic muscles. °SEEK MEDICAL CARE IF:  °· You have any vaginal bleeding during pregnancy. Treat this as a potential emergency. °· You have cramping pain felt low in the stomach (stronger than menstrual cramps). °· You notice vaginal discharge (watery, mucus, or bloody). °· You have a low, dull backache. °· There are regular contractions or uterine tightening. °SEEK IMMEDIATE MEDICAL CARE IF: °You have vaginal bleeding and have placenta previa.  °  °This information is not intended to replace advice given to you by your health care provider. Make sure you discuss any questions you have with   your health care provider. °  °Document Released: 07/24/2010 Document Revised: 06/21/2011 Document Reviewed: 09/30/2014 °Elsevier Interactive Patient Education ©2016 Elsevier Inc. ° °

## 2015-08-30 MED ORDER — BUPIVACAINE HCL (PF) 0.5 % IJ SOLN
INTRAMUSCULAR | Status: AC
  Filled 2015-08-30: qty 30

## 2015-09-01 ENCOUNTER — Encounter (HOSPITAL_COMMUNITY): Payer: Self-pay | Admitting: *Deleted

## 2015-09-07 NOTE — H&P (Signed)
Brianna Hall is a 40 y.o. female presenting for scheduled prophylactic cervical cerclage placement. Pt is a 39yo G3P1101 female. She had an uncomplicated first pregnancy with delivery via c/s at term in 2010. Last year she unfortunately developed preterm cervical shortening and subsequently preterm labor and delivery despite emergent cerclage at [redacted]weeks gestation. Pt has been counseled and agreed to prophylactic cerclage with this pregnancy. US done on 5/23 confirmed normal cervical length of >4cm and no dilation. Risks, benefits and alternatives to procedure have been reviewed with pt and partner in depth and all questions answered. Consent obtained.  Maternal Medical History:  Reason for admission: Nausea.  Scheduled prophylactic cervical cerclage placement  Fetal activity: None yet  Prenatal complications: no prenatal complications Hx of incompetent cervix    OB History    Gravida Para Term Preterm AB TAB SAB Ectopic Multiple Living   0 0 0 0 0 0 1     Past Medical History  Diagnosis Date  . SVD (spontaneous vaginal delivery) 01/10/2015    fetal demise  . S/P dilation and curettage 01/10/2015  . Anemia   . Gestational diabetes     Resolved with 2010 pregnancy only   Past Surgical History  Procedure Laterality Date  . Cholecystectomy    . Cervical cerclage N/A 01/02/2015    Procedure: CERCLAGE CERVICAL;  Surgeon: Edwinna Areola, DO;  Location: [facility];  Service: Gynecology;  Laterality: N/A;  . Cesarean section    . Cholecystectomy    . Dilation and evacuation N/A 01/10/2015    Procedure: DILATATION AND EVACUATION with cerclage removal ;  Surgeon: Sherian Rein, MD;  Location: [facility];  Service: Gynecology;  Laterality: N/A;   Family History: family history includes Heart disease in her father. Social History:  reports that she has never smoked. She has never used smokeless tobacco. She reports that she does not drink alcohol or use illicit drugs.   Prenatal  Transfer Tool  Maternal Diabetes: No Genetic Screening: Declined Maternal Ultrasounds/Referrals: Normal Fetal Ultrasounds or other Referrals:  None Maternal Substance Abuse:  No Significant Maternal Medications:  None Significant Maternal Lab Results:  None Other Comments:  None  Review of Systems  Constitutional: Positive for malaise/fatigue. Negative for fever, chills and weight loss.  Eyes: Negative for blurred vision and double vision.  Respiratory: Negative for shortness of breath.   Cardiovascular: Negative for chest pain.  Gastrointestinal: Positive for nausea and vomiting. Negative for heartburn and abdominal pain.  Genitourinary: Negative for dysuria.  Musculoskeletal: Negative for back pain.  Skin: Negative for itching and rash.  Neurological: Negative for dizziness and headaches.  Psychiatric/Behavioral: Negative for substance abuse. The patient is nervous/anxious.       Height  (1.549 m), weight 158 lb (71.668 kg), last menstrual period 08/31/2014, not currently breastfeeding. Maternal Exam:  Abdomen: Patient reports no abdominal tenderness. Fetal presentation: no presenting part  Cervix: Cervix evaluated by sterile speculum exam.     Physical Exam  Constitutional: She is oriented to person, place, and time. She appears well-developed and well-nourished.  Eyes: Pupils are equal, round, and reactive to light.  Neck: Normal range of motion.  Respiratory: Effort normal.  GI: Soft.  Genitourinary: Vagina normal and uterus normal.  Musculoskeletal: Normal range of motion. She exhibits no edema.  Neurological: She is alert and oriented to person, place, and time.  Skin: Skin is warm.  Psychiatric: She has a normal mood and affect. Her behavior is normal. Judgment and  thought content normal.    Prenatal labs: ABO, Rh: --/--/B POS (09/30 2130) Antibody: NEG (09/30 2130) Rubella:   RPR:    HBsAg:    HIV:    GBS:     Assessment/Plan: G3P1101 at 11 1/[redacted]wks  gestation on day of surgery for prophylactic cervical cerclage placement - stable R/B/A reviewed Consent obtained To OR when ready   Edwinna AreolaCecilia Worema Lilja Soland 09/07/2015, 8:39 AM

## 2015-09-09 ENCOUNTER — Ambulatory Visit (HOSPITAL_COMMUNITY)
Admission: RE | Admit: 2015-09-09 | Discharge: 2015-09-09 | Disposition: A | Discharge status: Home / Self Care | Payer: 59 | Source: Ambulatory Visit | Attending: Obstetrics and Gynecology | Admitting: Obstetrics and Gynecology

## 2015-09-09 ENCOUNTER — Ambulatory Visit (HOSPITAL_COMMUNITY): Payer: 59 | Admitting: Anesthesiology

## 2015-09-09 ENCOUNTER — Encounter (HOSPITAL_COMMUNITY): Payer: Self-pay | Admitting: Anesthesiology

## 2015-09-09 ENCOUNTER — Encounter (HOSPITAL_COMMUNITY)
Admission: RE | Disposition: A | Discharge status: Home / Self Care | Payer: Self-pay | Source: Ambulatory Visit | Attending: Obstetrics and Gynecology

## 2015-09-09 DIAGNOSIS — Z3A11 11 weeks gestation of pregnancy: Secondary | ICD-10-CM | POA: Diagnosis not present

## 2015-09-09 DIAGNOSIS — O3431 Maternal care for cervical incompetence, first trimester: Secondary | ICD-10-CM | POA: Insufficient documentation

## 2015-09-09 DIAGNOSIS — O3430 Maternal care for cervical incompetence, unspecified trimester: Secondary | ICD-10-CM

## 2015-09-09 HISTORY — PX: CERVICAL CERCLAGE: SHX1329

## 2015-09-09 LAB — TYPE AND SCREEN
ABO/RH(D): B POS
ANTIBODY SCREEN: NEGATIVE

## 2015-09-09 LAB — CBC
HCT: 36.8 % (ref 36.0–46.0)
HEMOGLOBIN: 12.6 g/dL (ref 12.0–15.0)
MCH: 30.5 pg (ref 26.0–34.0)
MCHC: 34.2 g/dL (ref 30.0–36.0)
MCV: 89.1 fL (ref 78.0–100.0)
Platelets: 305 10*3/uL (ref 150–400)
RBC: 4.13 MIL/uL (ref 3.87–5.11)
RDW: 12.4 % (ref 11.5–15.5)
WBC: 8.9 10*3/uL (ref 4.0–10.5)

## 2015-09-09 SURGERY — CERCLAGE, CERVIX, VAGINAL APPROACH
Anesthesia: Spinal | Site: Vagina

## 2015-09-09 MED ORDER — ACETAMINOPHEN 325 MG PO TABS: 650.0000 mg | ORAL_TABLET | ORAL | Status: DC | PRN

## 2015-09-09 MED ORDER — LIDOCAINE-EPINEPHRINE 1 %-1:100000 IJ SOLN
INTRAMUSCULAR | Status: DC | PRN
  Administered 2015-09-09: 7 mL

## 2015-09-09 MED ORDER — LACTATED RINGERS IV SOLN: INTRAVENOUS | Status: DC

## 2015-09-09 MED ORDER — BUPIVACAINE HCL (PF) 0.75 % IJ SOLN
INTRAMUSCULAR | Status: DC | PRN
  Administered 2015-09-09: 1.2 mL via INTRATHECAL

## 2015-09-09 MED ORDER — CEFAZOLIN SODIUM-DEXTROSE 2-4 GM/100ML-% IV SOLN
2.0000 g | INTRAVENOUS | Status: AC
  Administered 2015-09-09: 2 g via INTRAVENOUS

## 2015-09-09 MED ORDER — SOD CITRATE-CITRIC ACID 500-334 MG/5ML PO SOLN
30.0000 mL | ORAL | Status: AC
  Administered 2015-09-09: 30 mL via ORAL

## 2015-09-09 MED ORDER — DEXAMETHASONE SODIUM PHOSPHATE 4 MG/ML IJ SOLN
INTRAMUSCULAR | Status: DC | PRN
  Administered 2015-09-09: 4 mg via INTRAVENOUS

## 2015-09-09 MED ORDER — LIDOCAINE-EPINEPHRINE 1 %-1:100000 IJ SOLN
INTRAMUSCULAR | Status: AC
  Filled 2015-09-09: qty 1

## 2015-09-09 MED ORDER — CEFAZOLIN SODIUM-DEXTROSE 2-4 GM/100ML-% IV SOLN
INTRAVENOUS | Status: AC
  Filled 2015-09-09: qty 100

## 2015-09-09 MED ORDER — SCOPOLAMINE 1 MG/3DAYS TD PT72
1.0000 | MEDICATED_PATCH | TRANSDERMAL | Status: DC
  Administered 2015-09-09: 1.5 mg via TRANSDERMAL

## 2015-09-09 MED ORDER — ONDANSETRON HCL 4 MG PO TABS: 4.0000 mg | ORAL_TABLET | Freq: Four times a day (QID) | ORAL | Status: DC | PRN

## 2015-09-09 MED ORDER — ONDANSETRON HCL 4 MG/2ML IJ SOLN: 4.0000 mg | Freq: Four times a day (QID) | INTRAMUSCULAR | Status: DC | PRN

## 2015-09-09 MED ORDER — ONDANSETRON HCL 4 MG/2ML IJ SOLN
INTRAMUSCULAR | Status: DC | PRN
Start: 2015-09-09 — End: 2015-09-09
  Administered 2015-09-09: 4 mg via INTRAVENOUS

## 2015-09-09 MED ORDER — ACETAMINOPHEN-CODEINE 300-30 MG PO TABS: 1.0000 | ORAL_TABLET | ORAL | Status: DC | PRN

## 2015-09-09 MED ORDER — SIMETHICONE 80 MG PO CHEW
80.0000 mg | CHEWABLE_TABLET | Freq: Four times a day (QID) | ORAL | Status: DC | PRN
  Filled 2015-09-09: qty 1

## 2015-09-09 MED ORDER — SCOPOLAMINE 1 MG/3DAYS TD PT72
MEDICATED_PATCH | TRANSDERMAL | Status: AC
  Administered 2015-09-09: 1.5 mg via TRANSDERMAL
  Filled 2015-09-09: qty 1

## 2015-09-09 MED ORDER — LACTATED RINGERS IV SOLN
INTRAVENOUS | Status: DC
  Administered 2015-09-09 (×2): via INTRAVENOUS

## 2015-09-09 MED ORDER — FENTANYL CITRATE (PF) 100 MCG/2ML IJ SOLN: 25.0000 ug | INTRAMUSCULAR | Status: DC | PRN

## 2015-09-09 MED ORDER — SOD CITRATE-CITRIC ACID 500-334 MG/5ML PO SOLN
ORAL | Status: AC
  Administered 2015-09-09: 30 mL via ORAL
  Filled 2015-09-09: qty 15

## 2015-09-09 SURGICAL SUPPLY — 19 items
CLOTH BEACON ORANGE TIMEOUT ST (SAFETY) ×3 IMPLANT
COUNTER NEEDLE 1200 MAGNETIC (NEEDLE) ×3 IMPLANT
GLOVE BIO SURGEON STRL SZ 6.5 (GLOVE) ×4 IMPLANT
GLOVE BIO SURGEONS STRL SZ 6.5 (GLOVE) ×2
GLOVE BIOGEL PI IND STRL 7.0 (GLOVE) ×1 IMPLANT
GLOVE BIOGEL PI INDICATOR 7.0 (GLOVE) ×2
GOWN STRL REUS W/TWL LRG LVL3 (GOWN DISPOSABLE) ×6 IMPLANT
NEEDLE MAYO CATGUT SZ4 (NEEDLE) ×3 IMPLANT
PACK VAGINAL MINOR WOMEN LF (CUSTOM PROCEDURE TRAY) ×3 IMPLANT
PAD OB MATERNITY 4.3X12.25 (PERSONAL CARE ITEMS) ×3 IMPLANT
PAD PREP 24X48 CUFFED NSTRL (MISCELLANEOUS) ×3 IMPLANT
SUT POLYDEK 5 CE 75 36 (SUTURE) ×6 IMPLANT
SYR BULB IRRIGATION 50ML (SYRINGE) ×3 IMPLANT
TOWEL OR 17X24 6PK STRL BLUE (TOWEL DISPOSABLE) ×6 IMPLANT
TRAY FOLEY CATH SILVER 14FR (SET/KITS/TRAYS/PACK) ×3 IMPLANT
TUBING NON-CON 1/4 X 20 CONN (TUBING) ×2 IMPLANT
TUBING NON-CON 1/4 X 20' CONN (TUBING) ×1
WATER STERILE IRR 1000ML POUR (IV SOLUTION) ×3 IMPLANT
YANKAUER SUCT BULB TIP NO VENT (SUCTIONS) ×3 IMPLANT

## 2015-09-09 NOTE — Anesthesia Preprocedure Evaluation (Addendum)
Anesthesia Evaluation  Patient identified by MRN, date of birth, ID band Patient awake    Reviewed: Allergy & Precautions, H&P , NPO status , Patient's Chart, lab work & pertinent test results  Airway Mallampati: II  TM Distance: >3 FB Neck ROM: full    Dental no notable dental hx. (+) Dental Advisory Given, Teeth Intact   Pulmonary neg pulmonary ROS,    Pulmonary exam normal breath sounds clear to auscultation       Cardiovascular Exercise Tolerance: Good negative cardio ROS Normal cardiovascular exam Rhythm:regular Rate:Normal     Neuro/Psych negative neurological ROS  negative psych ROS   GI/Hepatic negative GI ROS, Neg liver ROS,   Endo/Other  negative endocrine ROSdiabetesGestational diabetes  Renal/GU negative Renal ROS  negative genitourinary   Musculoskeletal negative musculoskeletal ROS (+)   Abdominal   Peds negative pediatric ROS (+)  Hematology negative hematology ROS (+)   Anesthesia Other Findings   Reproductive/Obstetrics negative OB ROS                            Anesthesia Physical Anesthesia Plan  ASA: II  Anesthesia Plan: Spinal   Post-op Pain Management:    Induction:   Airway Management Planned:   Additional Equipment:   Intra-op Plan:   Post-operative Plan:   Informed Consent: I have reviewed the patients History and Physical, chart, labs and discussed the procedure including the risks, benefits and alternatives for the proposed anesthesia with the patient or authorized representative who has indicated his/her understanding and acceptance.   Dental Advisory Given  Plan Discussed with: CRNA  Anesthesia Plan Comments:        Anesthesia Quick Evaluation

## 2015-09-09 NOTE — Anesthesia Procedure Notes (Signed)
Spinal Patient location during procedure: OR Start time: 09/09/2015 11:38 AM End time: 09/09/2015 11:42 AM Staffing Anesthesiologist: Rod Mae Performed by: anesthesiologist  Preanesthetic Checklist Completed: patient identified, site marked, surgical consent, pre-op evaluation, timeout performed, IV checked, risks and benefits discussed and monitors and equipment checked Spinal Block Patient position: sitting Prep: Betadine Patient monitoring: heart rate, continuous pulse ox and blood pressure Approach: midline Location: L3-4 Injection technique: single-shot Needle Needle type: Pencan  Needle gauge: 24 G Needle length: 9 cm Assessment Sensory level: L2 Additional Notes Expiration date of kit checked and confirmed. Patient tolerated procedure well, without complications.

## 2015-09-09 NOTE — Discharge Instructions (Signed)
Nothing in vagina for 6 weeks.  No sex, tampons, and douching.  Other instructions as in [facility] Discharge Booklet. °

## 2015-09-09 NOTE — Op Note (Signed)
Operative Note    Preoperative Diagnosis: Incompetent cervix history; iup at 11weeks  Postoperative Diagnosis: same   Procedure: Prophylactic cervical cerclage   Surgeon: Dr Mindi SlickerBanga  Anesthesia: Spinal; lidocaine with epi 1%  Fluids: LR EBL: 25cc UOP: clear  Findings: Closed cervix, slight dark brown at os; CL>3cm; fhts confirmed before and after surgery   Specimen: none   Procedure Note Pt taken to OR and spinal anesthesia administered without complications. Pt placed in dorsal lithotomy position and appropriate time out done. Pt was prepped with betadine, foley catheter placed for bladder decompression and pt draped in sterile fashion. Exam under anesthesia confirmed cervix still closed. Speculum placed and local anesthesia with 1% lidocaine with epinephrine injected in a circumferential pattern at 12, 3, 6 and 9 o'clock. 0 polydex suture placed circumferentially with traction provided using a ring forceps. First knot placed at 12 o'clock. Second suture placed with knot at [employer]6oclock. Essentially no bleeding noted at this time. Warm rinse of vaginal vault performed. All instruments then removed from pts vagina. Counts correct. FHTs noted prior to taking pt to recovery room in stable condition. Tolerated procedure well. 2gm ancef given at start of procedure

## 2015-09-09 NOTE — Transfer of Care (Signed)
Immediate Anesthesia Transfer of Care Note  Patient: Brianna Hall  Procedure(s) Performed: Procedure(s): CERCLAGE CERVICAL (N/A)  Patient Location: PACU  Anesthesia Type:Spinal  Level of Consciousness: awake, alert , oriented and patient cooperative  Airway & Oxygen Therapy: Patient Spontanous Breathing  Post-op Assessment: Report given to RN and Post -op Vital signs reviewed and stable  Post vital signs: Reviewed and stable  Last Vitals: BP 121/77 HR 75 RR 21 TEMP 98.1 POX 99   Last Pain:  0    Complications: No apparent anesthesia complications

## 2015-09-09 NOTE — Anesthesia Postprocedure Evaluation (Addendum)
Anesthesia Post Note  Patient: Brianna Hall  Procedure(s) Performed: Procedure(s) (LRB): CERCLAGE CERVICAL (N/A)  Patient location during evaluation: PACU Anesthesia Type: Spinal Level of consciousness: awake and alert Pain management: pain level controlled Vital Signs Assessment: post-procedure vital signs reviewed and stable Respiratory status: spontaneous breathing, nonlabored ventilation, respiratory function stable and patient connected to nasal cannula oxygen Cardiovascular status: blood pressure returned to baseline and stable Postop Assessment: no signs of nausea or vomiting Anesthetic complications: no    Last Vitals:  Filed Vitals:   09/09/15 1445 09/09/15 1510  BP: 112/84 107/71  Pulse: 71 75  Temp: 37.1 C 36.8 C  Resp: 18 16    Last Pain:  Filed Vitals:   09/09/15 1530  PainSc: 0-No pain                 Loui Massenburg L

## 2015-09-09 NOTE — Interval H&P Note (Signed)
History and Physical Interval Note:  09/09/2015 11:29 AM  Brianna Hall  has presented today for surgery, with the diagnosis of incompetent cervix  The various methods of treatment have been discussed with the patient and family. After consideration of risks, benefits and other options for treatment, the patient has consented to  Procedure(s): CERCLAGE CERVICAL (N/A) as a surgical intervention .  The patient's history has been reviewed, patient examined, no change in status, stable for surgery.  I have reviewed the patient's chart and labs.  Questions were answered to the patient's satisfaction.     Katharine Rochefort Medco Health SolutionsWorema Kylor Valverde

## 2015-09-10 ENCOUNTER — Encounter (HOSPITAL_COMMUNITY): Payer: Self-pay | Admitting: Obstetrics and Gynecology

## 2015-10-10 ENCOUNTER — Inpatient Hospital Stay (HOSPITAL_COMMUNITY)
Admission: AD | Admit: 2015-10-10 | Discharge: 2015-10-11 | DRG: 779 | Disposition: A | Discharge status: Home / Self Care | Payer: Medicaid Other | Source: Ambulatory Visit | Attending: Obstetrics and Gynecology | Admitting: Obstetrics and Gynecology

## 2015-10-10 ENCOUNTER — Inpatient Hospital Stay (HOSPITAL_COMMUNITY): Payer: Medicaid Other

## 2015-10-10 ENCOUNTER — Encounter (HOSPITAL_COMMUNITY): Payer: Self-pay | Admitting: *Deleted

## 2015-10-10 DIAGNOSIS — O3432 Maternal care for cervical incompetence, second trimester: Secondary | ICD-10-CM | POA: Diagnosis present

## 2015-10-10 DIAGNOSIS — Z3A15 15 weeks gestation of pregnancy: Secondary | ICD-10-CM

## 2015-10-10 DIAGNOSIS — O42012 Preterm premature rupture of membranes, onset of labor within 24 hours of rupture, second trimester: Secondary | ICD-10-CM | POA: Diagnosis present

## 2015-10-10 DIAGNOSIS — O26899 Other specified pregnancy related conditions, unspecified trimester: Secondary | ICD-10-CM

## 2015-10-10 DIAGNOSIS — O09292 Supervision of pregnancy with other poor reproductive or obstetric history, second trimester: Secondary | ICD-10-CM | POA: Diagnosis not present

## 2015-10-10 DIAGNOSIS — O021 Missed abortion: Secondary | ICD-10-CM | POA: Diagnosis present

## 2015-10-10 DIAGNOSIS — O4290 Premature rupture of membranes, unspecified as to length of time between rupture and onset of labor, unspecified weeks of gestation: Secondary | ICD-10-CM

## 2015-10-10 DIAGNOSIS — O9989 Other specified diseases and conditions complicating pregnancy, childbirth and the puerperium: Secondary | ICD-10-CM

## 2015-10-10 DIAGNOSIS — R109 Unspecified abdominal pain: Secondary | ICD-10-CM | POA: Diagnosis not present

## 2015-10-10 DIAGNOSIS — O42919 Preterm premature rupture of membranes, unspecified as to length of time between rupture and onset of labor, unspecified trimester: Secondary | ICD-10-CM | POA: Diagnosis present

## 2015-10-10 HISTORY — DX: Major depressive disorder, single episode, unspecified: F32.9

## 2015-10-10 HISTORY — DX: Depression, unspecified: F32.A

## 2015-10-10 LAB — CBC
HCT: 36.8 % (ref 36.0–46.0)
Hemoglobin: 12.7 g/dL (ref 12.0–15.0)
MCH: 30.8 pg (ref 26.0–34.0)
MCHC: 34.5 g/dL (ref 30.0–36.0)
MCV: 89.1 fL (ref 78.0–100.0)
Platelets: 282 10*3/uL (ref 150–400)
RBC: 4.13 MIL/uL (ref 3.87–5.11)
RDW: 12.7 % (ref 11.5–15.5)
WBC: 11.8 10*3/uL — ABNORMAL HIGH (ref 4.0–10.5)

## 2015-10-10 LAB — POCT FERN TEST: POCT FERN TEST: POSITIVE

## 2015-10-10 LAB — WET PREP, GENITAL
CLUE CELLS WET PREP: NONE SEEN
Sperm: NONE SEEN
TRICH WET PREP: NONE SEEN
Yeast Wet Prep HPF POC: NONE SEEN

## 2015-10-10 LAB — URINALYSIS, ROUTINE W REFLEX MICROSCOPIC
Bilirubin Urine: NEGATIVE
GLUCOSE, UA: NEGATIVE mg/dL
Ketones, ur: NEGATIVE mg/dL
Nitrite: NEGATIVE
Protein, ur: 30 mg/dL — AB
SPECIFIC GRAVITY, URINE: 1.01 (ref 1.005–1.030)
pH: 6 (ref 5.0–8.0)

## 2015-10-10 LAB — TYPE AND SCREEN
ABO/RH(D): B POS
ANTIBODY SCREEN: NEGATIVE

## 2015-10-10 LAB — URINE MICROSCOPIC-ADD ON

## 2015-10-10 LAB — AMNISURE RUPTURE OF MEMBRANE (ROM) NOT AT ARMC: Amnisure ROM: NEGATIVE

## 2015-10-10 MED ORDER — SOD CITRATE-CITRIC ACID 500-334 MG/5ML PO SOLN: 30.0000 mL | ORAL | Status: DC | PRN

## 2015-10-10 MED ORDER — MISOPROSTOL 200 MCG PO TABS
600.0000 ug | ORAL_TABLET | Freq: Four times a day (QID) | ORAL | Status: DC
  Administered 2015-10-10 – 2015-10-11 (×2): 600 ug via VAGINAL
  Filled 2015-10-10 (×2): qty 3

## 2015-10-10 MED ORDER — OXYTOCIN 40 UNITS IN LACTATED RINGERS INFUSION - SIMPLE MED
2.5000 [IU]/h | INTRAVENOUS | Status: DC
  Filled 2015-10-10: qty 1000

## 2015-10-10 MED ORDER — LACTATED RINGERS IV SOLN
INTRAVENOUS | Status: DC
  Administered 2015-10-11: 01:00:00 via INTRAVENOUS

## 2015-10-10 MED ORDER — ONDANSETRON HCL 4 MG/2ML IJ SOLN
4.0000 mg | Freq: Four times a day (QID) | INTRAMUSCULAR | Status: DC | PRN
Start: 2015-10-10 — End: 2015-10-11

## 2015-10-10 MED ORDER — OXYCODONE-ACETAMINOPHEN 5-325 MG PO TABS: 1.0000 | ORAL_TABLET | ORAL | Status: DC | PRN

## 2015-10-10 MED ORDER — ACETAMINOPHEN 325 MG PO TABS: 650.0000 mg | ORAL_TABLET | ORAL | Status: DC | PRN

## 2015-10-10 MED ORDER — LIDOCAINE HCL (PF) 1 % IJ SOLN: 30.0000 mL | INTRAMUSCULAR | Status: DC | PRN

## 2015-10-10 MED ORDER — OXYTOCIN BOLUS FROM INFUSION
500.0000 mL | INTRAVENOUS | Status: DC
  Administered 2015-10-11: 999 mL/h via INTRAVENOUS

## 2015-10-10 MED ORDER — OXYCODONE-ACETAMINOPHEN 5-325 MG PO TABS: 2.0000 | ORAL_TABLET | ORAL | Status: DC | PRN

## 2015-10-10 MED ORDER — BUTORPHANOL TARTRATE 1 MG/ML IJ SOLN
1.0000 mg | INTRAMUSCULAR | Status: DC | PRN
  Administered 2015-10-10 – 2015-10-11 (×3): 1 mg via INTRAVENOUS
  Filled 2015-10-10 (×3): qty 1

## 2015-10-10 NOTE — H&P (Signed)
Brianna Hall is a 40 y.o. female, G3 P1101, EGA 15+ weeks presenting for gush of fluid.  She had a large gush of fluid this morning, small amount after that, no pain or bleeding.  On eval in MAU, ? Fern, amnisure neg, but no amniotic fluid on u/s.  Maternal Medical History:  Reason for admission: Rupture of membranes.     OB History    Gravida Para Term Preterm AB TAB SAB Ectopic Multiple Living   3 2 1  0 0 0 0 0 0 1     Past Medical History  Diagnosis Date  . SVD (spontaneous vaginal delivery) 01/10/2015    fetal demise  . S/P dilation and curettage 01/10/2015  . Anemia   . Gestational diabetes     Resolved with 2010 pregnancy only  . Depression    Past Surgical History  Procedure Laterality Date  . Cholecystectomy    . Cervical cerclage N/A 01/02/2015    Procedure: CERCLAGE CERVICAL;  Surgeon: Edwinna Areolaecilia Worema Banga, DO;  Location: [facility];  Service: Gynecology;  Laterality: N/A;  . Cesarean section    . Cholecystectomy    . Dilation and evacuation N/A 01/10/2015    Procedure: DILATATION AND EVACUATION with cerclage removal ;  Surgeon: Sherian ReinJody Bovard-Stuckert, MD;  Location: [facility];  Service: Gynecology;  Laterality: N/A;  . Cervical cerclage N/A 09/09/2015    Procedure: CERCLAGE CERVICAL;  Surgeon: Edwinna Areolaecilia Worema Banga, DO;  Location: [facility];  Service: Gynecology;  Laterality: N/A;   Family History: family history includes Heart disease in her father. Social History:  reports that she has never smoked. She has never used smokeless tobacco. She reports that she does not drink alcohol or use illicit drugs.   Review of Systems  Respiratory: Negative.   Cardiovascular: Negative.       Blood pressure 112/71, pulse 70, temperature 98.8 F (37.1 C), temperature source Oral, resp. rate 20, height 5' 1.5" (1.562 m), weight 72.122 kg (159 lb), last menstrual period 06/23/2015, not currently breastfeeding. Exam Physical Exam  Vitals reviewed. Constitutional: She appears well-developed  and well-nourished.  Cardiovascular: Normal rate, regular rhythm and normal heart sounds.   No murmur heard. Respiratory: Effort normal and breath sounds normal. No respiratory distress. She has no wheezes.  GI: Soft. There is no tenderness.  Spec-2 cerclage sutures in place, both snipped and removed intact VE-long and closed  Prenatal labs: ABO, Rh: --/--/B POS (05/30 1005) Antibody: NEG (05/30 1005) Rubella: Immune (05/15 0000) RPR: Nonreactive (05/15 0000)  HBsAg: Negative (05/15 0000)  HIV: Non-reactive (05/15 0000)  GBS:     Assessment/Plan: IUP at 15 weeks, incompetent cervix with PPROM.  Discussed poor prognosis with PPROM this early, but gave her the option of PO antibiotics and outpatient observation.  Given poor prognosis, I recommended admission, remove cerclage and induce delivery.  She has agreed to this.  2 cerclage sutures removed, 600 mg cytotec placed vaginally, will monitor progress.  She is at risk for PPH with retained placenta, discussed possibility of needing D&C.     Tuwana Kapaun D 10/10/2015, 8:24 PM

## 2015-10-10 NOTE — MAU Note (Signed)
No fluid currently

## 2015-10-10 NOTE — MAU Provider Note (Signed)
Chief Complaint: Rupture of Membranes   First Provider Initiated Contact with Patient 10/10/15 1421     SUBJECTIVE HPI: Brianna Hall is a 40 y.o. G3P1001 at 324w4d who presents to Maternity Admissions reporting large gush of clear fluid at 1:30 pm today and mild cramping today. 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 Had prophylactic cerclage placed 09/09/15 by Dr. Mindi SlickerBanga. CL was normal at US 08/29/15. No other US's since then. Pt is scheduled to start 17-P next week.   Location: suprapubic Quality: cramping Severity: mild Duration: <1 hours Context: none Timing: intermittent Modifying factors: none Associated signs and symptoms: Pos for ? LOF. Neg for VB, urinary complaints, GI complaints or fever.   Past Medical History  Diagnosis Date  . SVD (spontaneous vaginal delivery) 01/10/2015    fetal demise  . S/P dilation and curettage 01/10/2015  . Anemia   . Gestational diabetes     Resolved with 2010 pregnancy only   OB History  Gravida Para Term Preterm AB SAB TAB Ectopic Multiple Living  3 2 1  0 0 0 0 0 0 1    # Outcome Date GA Lbr Len/2nd Weight Sex Delivery Anes PTL Lv  3 Current           2 Para 01/10/15 6964w2d 104:41 9.1 oz (0.258 kg) F Vag-Spont None  Y  1 Term      CS-LTranv        Past Surgical History  Procedure Laterality Date  . Cholecystectomy    . Cervical cerclage N/A 01/02/2015    Procedure: CERCLAGE CERVICAL;  Surgeon: Edwinna Areolaecilia Worema Banga, DO;  Location: [facility];  Service: Gynecology;  Laterality: N/A;  . Cesarean section    . Cholecystectomy    . Dilation and evacuation N/A 01/10/2015    Procedure: DILATATION AND EVACUATION with cerclage removal ;  Surgeon: Sherian ReinJody Bovard-Stuckert, MD;  Location: [facility];  Service: Gynecology;  Laterality: N/A;  . Cervical cerclage N/A 09/09/2015    Procedure: CERCLAGE CERVICAL;  Surgeon: Edwinna Areolaecilia Worema Banga, DO;  Location: [facility];  Service: Gynecology;  Laterality: N/A;   Social History   Social History  .  Marital Status: Married    Spouse Name: N/A  . Number of Children: N/A  . Years of Education: N/A   Occupational History  . Not on file.   Social History Main Topics  . Smoking status: Never Smoker   . Smokeless tobacco: Never Used  . Alcohol Use: No  . Drug Use: No  . Sexual Activity: Yes     Comment: approx 9 - 10 qka gestation   Other Topics Concern  . Not on file   Social History Narrative   No current facility-administered medications on file prior to encounter.   Current Outpatient Prescriptions on File Prior to Encounter  Medication Sig Dispense Refill  . Acetaminophen-Codeine (TYLENOL/CODEINE #3) 300-30 MG tablet Take 1 tablet by mouth every 4 (four) hours as needed for pain. 20 tablet 0  . ferrous sulfate (FERROUSUL) 325 (65 FE) MG tablet Take 1 tablet (325 mg total) by mouth 2 (two) times daily with a meal. (Patient taking differently: Take 325 mg by mouth once a week. ) 30 tablet 3  . Prenatal Vit-Fe Fumarate-FA (PRENATAL MULTIVITAMIN) TABS tablet Take 1 tablet by mouth daily at 12 noon.     No Known Allergies  I have reviewed the past Medical Hx, Surgical Hx, Social Hx, Allergies and Medications.   Review of Systems  Constitutional: Negative for fever and chills.  Gastrointestinal: Positive for abdominal pain. Negative for nausea, vomiting, diarrhea and constipation.  Genitourinary: Positive for vaginal discharge (LOF x 1). Negative for vaginal bleeding.  Musculoskeletal: Negative for back pain.    OBJECTIVE Patient Vitals for the past 24 hrs:  BP Temp Temp src Pulse Resp Height Weight  10/10/15 1407 113/69 mmHg 98.8 F (37.1 C) Oral 73 18 - -  10/10/15 1404 - - - - - 5' 1.5" (1.562 m) 159 lb (72.122 kg)   Constitutional: Well-developed, well-nourished female in no acute distress. Anxious.  Cardiovascular: normal rate Respiratory: normal rate and effort.  GI: Abd soft, non-tender, gravid appropriate for gestational age.  MS: Extremities nontender, no  edema, normal ROM Neurologic: Alert and oriented x 4.  GU: Neg CVAT.  SPECULUM EXAM: NEFG, ~15-20 cc pooling of slightly cloudy, thin, odorless fluid. No mucus. No blood noted, cervix visibly closed. Cerclage knot visible.   BIMANUAL: Deferred  FHR 167 by doppler  LAB RESULTS Results for orders placed or performed during the hospital encounter of 10/10/15 (from the past 24 hour(s))  Urinalysis, Routine w reflex microscopic (not at [facility])     Status: Abnormal   Collection Time: 10/10/15  2:13 PM  Result Value Ref Range   Color, Urine YELLOW YELLOW   APPearance CLEAR CLEAR   Specific Gravity, Urine 1.010 1.005 - 1.030   pH 6.0 5.0 - 8.0   Glucose, UA NEGATIVE NEGATIVE mg/dL   Hgb urine dipstick MODERATE (A) NEGATIVE   Bilirubin Urine NEGATIVE NEGATIVE   Ketones, ur NEGATIVE NEGATIVE mg/dL   Protein, ur 30 (A) NEGATIVE mg/dL   Nitrite NEGATIVE NEGATIVE   Leukocytes, UA TRACE (A) NEGATIVE  Urine microscopic-add on     Status: Abnormal   Collection Time: 10/10/15  2:13 PM  Result Value Ref Range   Squamous Epithelial / LPF 6-30 (A) NONE SEEN   WBC, UA 6-30 0 - 5 WBC/hpf   RBC / HPF 0-5 0 - 5 RBC/hpf   Bacteria, UA MANY (A) NONE SEEN  Wet prep, genital     Status: Abnormal   Collection Time: 10/10/15  2:29 PM  Result Value Ref Range   Yeast Wet Prep HPF POC NONE SEEN NONE SEEN   Trich, Wet Prep NONE SEEN NONE SEEN   Clue Cells Wet Prep HPF POC NONE SEEN NONE SEEN   WBC, Wet Prep HPF POC MANY (A) NONE SEEN   Sperm NONE SEEN   POCT fern test     Status: None   Collection Time: 10/10/15  2:31 PM  Result Value Ref Range   POCT Fern Test Positive = ruptured amniotic membanes   Amnisure rupture of membrane (rom)not at [facility]     Status: None   Collection Time: 10/10/15  3:02 PM  Result Value Ref Range   Amnisure ROM NEGATIVE     IMAGING No results found.  MAU COURSE Sterile Spec exam, fern, amnisure. Discussed Hx, exam, labs, probable positive fern, but neg Amnisure w/ Dr.  Jackelyn Knife. Will order [place] for AFI, CL.   US shows no fluid, normal CL, breech. Discussed w/ Dr. Jackelyn Knife who will come discuss management options w/ pt and FOB.  MDM -  PPPROM, anhydramnios. Extremely poor prognosis for fetus and extremely high risk for chorio, spontaneous labor. Pt also has Hx of significant PPH w/ 19 week delivery last year. Pt requesting cytotec induction.    ASSESSMENT 1. Leakage of amniotic fluid   2. [redacted] weeks  gestation of pregnancy   3. History of cervical incompetence in pregnancy, currently pregnant, second trimester   4. Cervical cerclage suture present in second trimester   5. Abdominal pain affecting pregnancy, antepartum   6.      Hx PPH  PLAN Admit to L&D per Dr. Jackelyn KnifeMeisinger. Cytotec Support given. [facility] Healthffered Chaplain, declined. Clear liquids T&S  Dorathy KinsmanVirginia Tyquisha Sharps, [place] 10/10/2015  6:21 PM

## 2015-10-10 NOTE — MAU Note (Signed)
?   Loss of fluid about 30 min ago.  Denies any pain or bleeding.  Clear fluid.

## 2015-10-11 ENCOUNTER — Encounter (HOSPITAL_COMMUNITY): Payer: Self-pay | Admitting: *Deleted

## 2015-10-11 LAB — CBC
HCT: 24.8 % — ABNORMAL LOW (ref 36.0–46.0)
HEMOGLOBIN: 8.6 g/dL — AB (ref 12.0–15.0)
MCH: 31.4 pg (ref 26.0–34.0)
MCHC: 35.1 g/dL (ref 30.0–36.0)
MCV: 89.5 fL (ref 78.0–100.0)
Platelets: 227 10*3/uL (ref 150–400)
RBC: 2.77 MIL/uL — ABNORMAL LOW (ref 3.87–5.11)
RDW: 12.6 % (ref 11.5–15.5)
WBC: 13 10*3/uL — ABNORMAL HIGH (ref 4.0–10.5)

## 2015-10-11 LAB — CULTURE, OB URINE
Culture: NO GROWTH
Special Requests: NORMAL

## 2015-10-11 MED ORDER — METHYLERGONOVINE MALEATE 0.2 MG/ML IJ SOLN: 0.2000 mg | INTRAMUSCULAR | Status: DC | PRN

## 2015-10-11 MED ORDER — DIPHENHYDRAMINE HCL 25 MG PO CAPS: 25.0000 mg | ORAL_CAPSULE | Freq: Four times a day (QID) | ORAL | Status: DC | PRN

## 2015-10-11 MED ORDER — ONDANSETRON HCL 4 MG PO TABS: 4.0000 mg | ORAL_TABLET | ORAL | Status: DC | PRN

## 2015-10-11 MED ORDER — IBUPROFEN 600 MG PO TABS: 600.0000 mg | ORAL_TABLET | Freq: Four times a day (QID) | ORAL | Status: DC

## 2015-10-11 MED ORDER — BENZOCAINE-MENTHOL 20-0.5 % EX AERO
1.0000 "application " | INHALATION_SPRAY | CUTANEOUS | Status: DC | PRN
  Filled 2015-10-11: qty 56

## 2015-10-11 MED ORDER — OXYCODONE HCL 5 MG PO TABS: 5.0000 mg | ORAL_TABLET | ORAL | Status: DC | PRN

## 2015-10-11 MED ORDER — PRENATAL MULTIVITAMIN CH: 1.0000 | ORAL_TABLET | Freq: Every day | ORAL | Status: DC

## 2015-10-11 MED ORDER — METHYLERGONOVINE MALEATE 0.2 MG PO TABS
0.2000 mg | ORAL_TABLET | ORAL | Status: DC | PRN
Start: 2015-10-11 — End: 2015-10-11

## 2015-10-11 MED ORDER — MEASLES, MUMPS & RUBELLA VAC ~~LOC~~ INJ
0.5000 mL | INJECTION | Freq: Once | SUBCUTANEOUS | Status: DC
  Filled 2015-10-11: qty 0.5

## 2015-10-11 MED ORDER — ZOLPIDEM TARTRATE 5 MG PO TABS: 5.0000 mg | ORAL_TABLET | Freq: Every evening | ORAL | Status: DC | PRN

## 2015-10-11 MED ORDER — ONDANSETRON HCL 4 MG/2ML IJ SOLN: 4.0000 mg | INTRAMUSCULAR | Status: DC | PRN

## 2015-10-11 MED ORDER — MAGNESIUM HYDROXIDE 400 MG/5ML PO SUSP: 30.0000 mL | ORAL | Status: DC | PRN

## 2015-10-11 MED ORDER — IBUPROFEN 600 MG PO TABS: 600.0000 mg | ORAL_TABLET | Freq: Four times a day (QID) | ORAL | Status: AC

## 2015-10-11 MED ORDER — SIMETHICONE 80 MG PO CHEW: 80.0000 mg | CHEWABLE_TABLET | ORAL | Status: DC | PRN

## 2015-10-11 MED ORDER — WITCH HAZEL-GLYCERIN EX PADS: 1.0000 "application " | MEDICATED_PAD | CUTANEOUS | Status: DC | PRN

## 2015-10-11 MED ORDER — SENNOSIDES-DOCUSATE SODIUM 8.6-50 MG PO TABS: 2.0000 | ORAL_TABLET | ORAL | Status: DC

## 2015-10-11 MED ORDER — OXYCODONE HCL 5 MG PO TABS: 10.0000 mg | ORAL_TABLET | ORAL | Status: DC | PRN

## 2015-10-11 MED ORDER — ACETAMINOPHEN 325 MG PO TABS
650.0000 mg | ORAL_TABLET | ORAL | Status: DC | PRN
Start: 2015-10-11 — End: 2015-10-11

## 2015-10-11 MED ORDER — DIBUCAINE 1 % RE OINT
1.0000 "application " | TOPICAL_OINTMENT | RECTAL | Status: DC | PRN
  Filled 2015-10-11: qty 28.4

## 2015-10-11 NOTE — Progress Notes (Addendum)
Chaplain spoke briefly w/pt's husband. They were resting when I arrived. Offered support and assistance as needed. Pt's husband spoke of how this is their second loss, the first occurring in Sept. He also said they have a 42 year old to whom they have to break the news. He said they have support from their church and family and appreciated spt from hospital pastoral care.  CH offered support for their grief and presence. Please page if needed prior to discharge. Chaplain Marjory LiesPamela Carrington Holder, M.Div.   10/11/15 1600  Clinical Encounter Type  Visited With Patient and family together

## 2015-10-11 NOTE — Progress Notes (Signed)
Pt sleeping well after stadol. Pt with continued heavy show, a few cherry sized clots noted. Vaginal exam prior to cytotec placement. ? 5 cm? Difficult exam due to pts discomfort and fetal position. Cytotec placed as ordered. Will continue to monitor.

## 2015-10-11 NOTE — Discharge Instructions (Signed)
Call for heavy bleeding or severe pain

## 2015-10-11 NOTE — Progress Notes (Signed)
Pt sleeping after stadol. Heavy show continues, no vaginal exam needed at this point. Report given.

## 2015-10-11 NOTE — Progress Notes (Signed)
Feeling a little pain and pressure, received last cytotec at about 0430 Afeb, VSS VE-I mostly feel fetal parts and clot, unable to palpate cervix, no descent with push Will continue observation, recheck in about 2 hours and see if needs next cytotec

## 2015-10-11 NOTE — Progress Notes (Signed)
Per RN bleeding is minimal Hgb 12.7 to 8.6 Stable for discharge home

## 2015-10-11 NOTE — Procedures (Signed)
Delivery Note On exam, baby sitting in vagina with clots.  I asked her to push and she was able to deliver the baby, no FHR.  She pushed again and expelled about 200cc clots.  On palpation placenta seems to be sitting in the cervix, she was unable to push it out.  Graves speculum inserted, placenta removed intact with ring forceps with another 100cc clots.  Fundus firm, no placental fragments palpated.  Speculum removed.  Perineum intact.    Will observe for several hours, recheck CBC at 1400 due to h/o PPH after last early delivery, consider d/c home later today if stable.

## 2015-10-15 NOTE — Discharge Summary (Signed)
    OB Discharge Summary     Patient Name: Brianna Hall DOB: [birth year] MRN: [id number]  Date of admission: 10/10/2015 Delivering MD: Jackelyn KnifeMEISINGER, Jahnya Trindade   Date of discharge: 10/11/2015  Admitting diagnosis: 15WKS,WATER BROKE Intrauterine pregnancy: 4367w5d     Secondary diagnosis:  Active Problems:   Preterm premature rupture of membranes (PPROM) with unknown onset of labor      Discharge diagnosis: PPROM at 15 weeks, incompetent cervix                                                                                                Hospital course:  Pt seen in MAU for leaking fluid at 15 weeks.  Fern +, Amnisure neg, no AFV on ultrasound.  Options discussed, pt elected admission, removal of cerclage sutures and induction.  Cerclage sutures easily removed, induction with 2 doses of cytotec 600 mg.  On the morning of 7-1 she delivered vaginally a stillborn fetus, placenta was removed with ring forceps and appeared to be intact.  She had some bleeding during induction, no significant bleeding postpartum.  Hgb later on 7-1 appropriate, bleeding stable, pt stable for d/c home.  Physical exam  Filed Vitals:   10/11/15 1311 10/11/15 1355 10/11/15 1500 10/11/15 1610  BP: 88/58 84/44 94/57  90/54  Pulse: 68 70 66 64  Temp:   98.2 F (36.8 C)   TempSrc:   Oral   Resp:      Height:      Weight:       General: alert Lochia: appropriate Uterine Fundus: firm  Labs: Lab Results  Component Value Date   WBC 13.0* 10/11/2015   HGB 8.6* 10/11/2015   HCT 24.8* 10/11/2015   MCV 89.5 10/11/2015   PLT 227 10/11/2015   No flowsheet data found.  Discharge instruction: per After Visit Summary and "Baby and Me Booklet".  After visit meds:    Medication List    STOP taking these medications        Acetaminophen-Codeine 300-30 MG tablet  Commonly known as:  TYLENOL/CODEINE #3      TAKE these medications        ferrous sulfate 325 (65 FE) MG tablet  Commonly known as:  FERROUSUL  Take 1  tablet (325 mg total) by mouth 2 (two) times daily with a meal.     ibuprofen 600 MG tablet  Commonly known as:  ADVIL,MOTRIN  Take 1 tablet (600 mg total) by mouth every 6 (six) hours.     prenatal multivitamin Tabs tablet  Take 1 tablet by mouth daily at 12 noon.        Diet: routine diet  Activity: Advance as tolerated. Pelvic rest for 6 weeks.   Outpatient follow up:3 weeks   Newborn Data: stillborn female  Birth Weight: 3 oz (85 g) APGAR: 0, 0    10/15/2015 Amalio Loe D, MD

## 2015-10-23 ENCOUNTER — Encounter (HOSPITAL_COMMUNITY): Payer: Self-pay | Admitting: *Deleted

## 2016-04-27 IMAGING — US US MFM OB LIMITED
1 series · 15 of 27 positions shown · non-contrast
Comparison: none

[Series 1: us mfm ob limited · 15 of 27 slices shown]
[im 1/27]
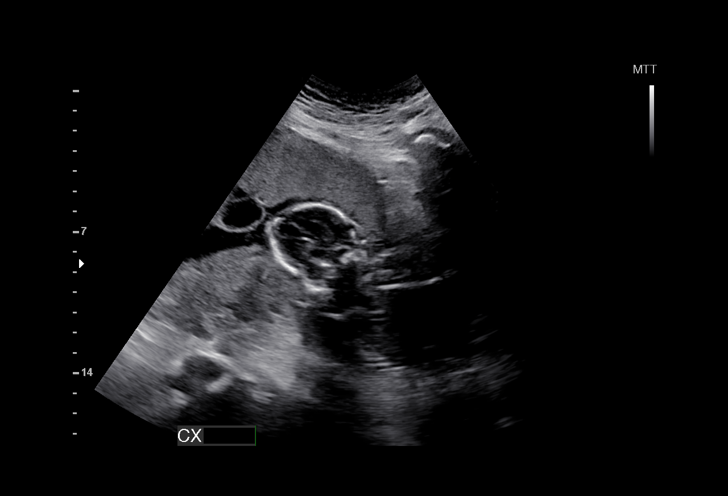
[im 3/27]
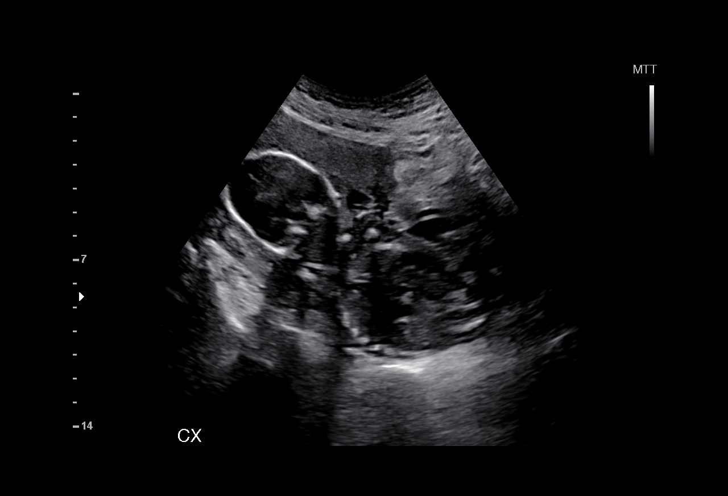
[im 5/27]
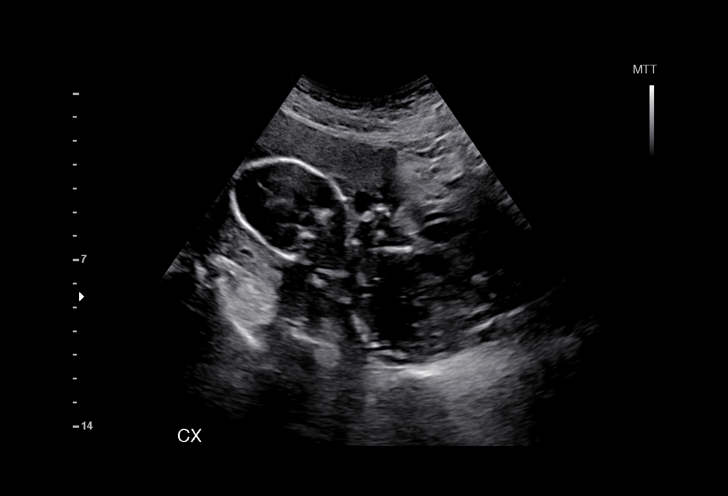
[im 7/27]
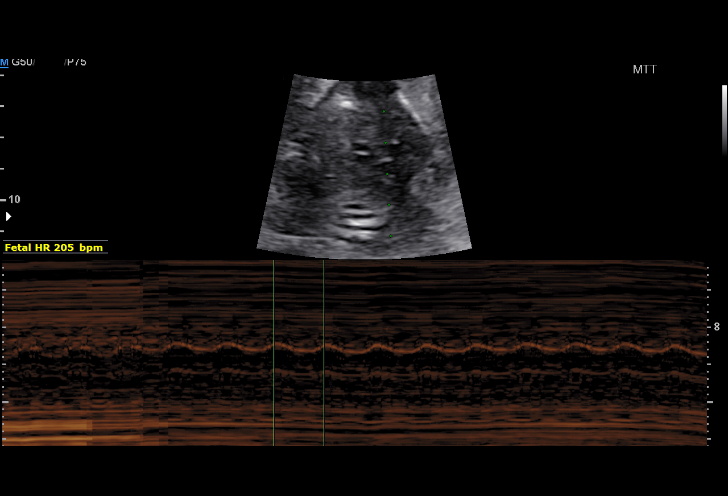
[im 9/27]
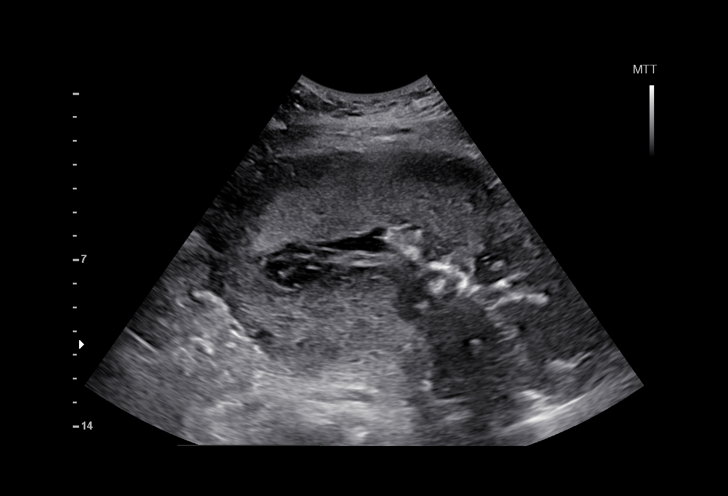
[im 10/27]
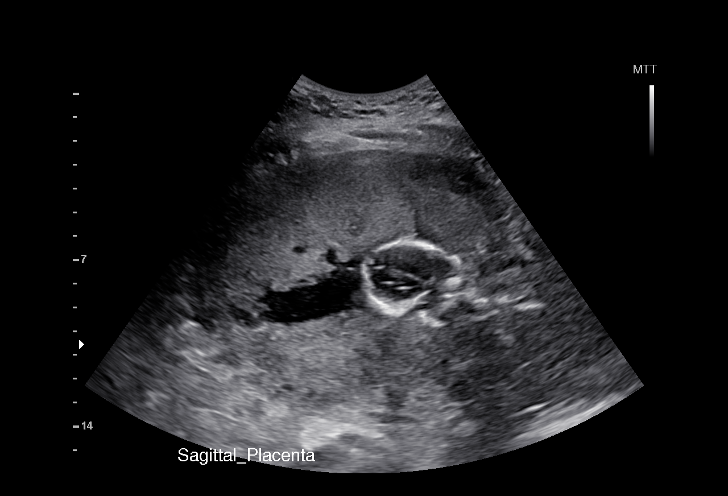
[im 12/27]
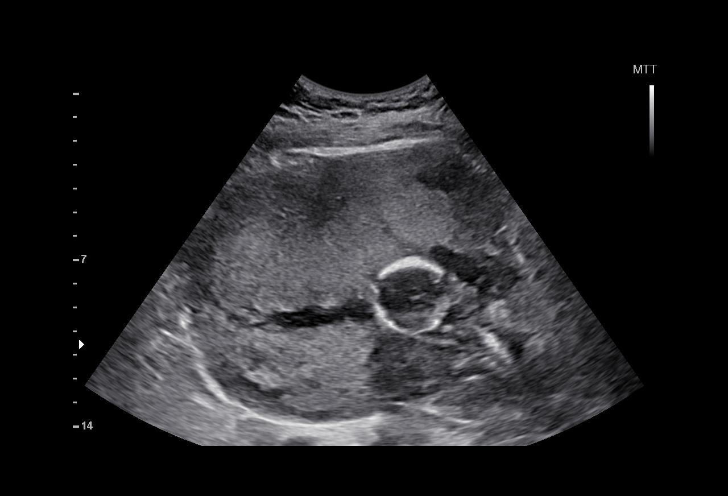
[im 14/27]
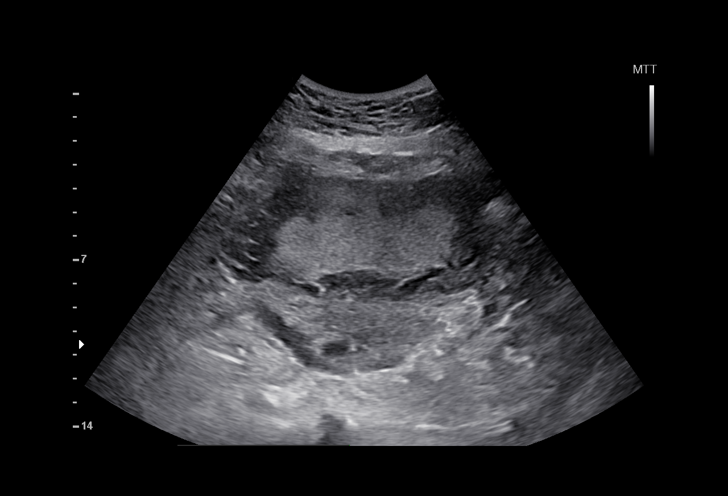
[im 16/27]
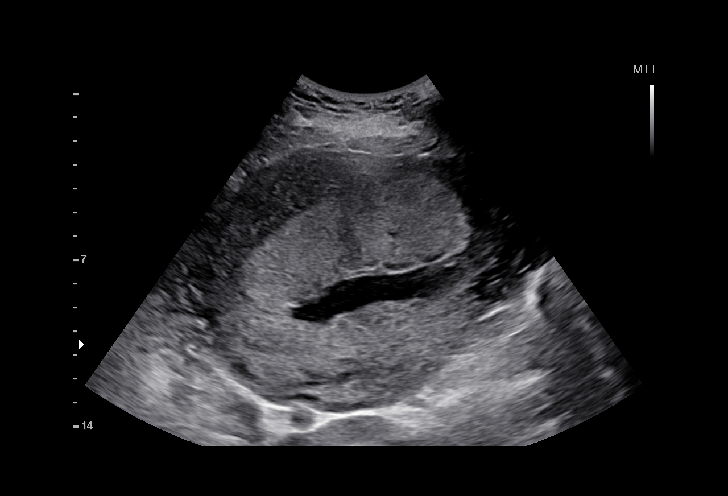
[im 18/27]
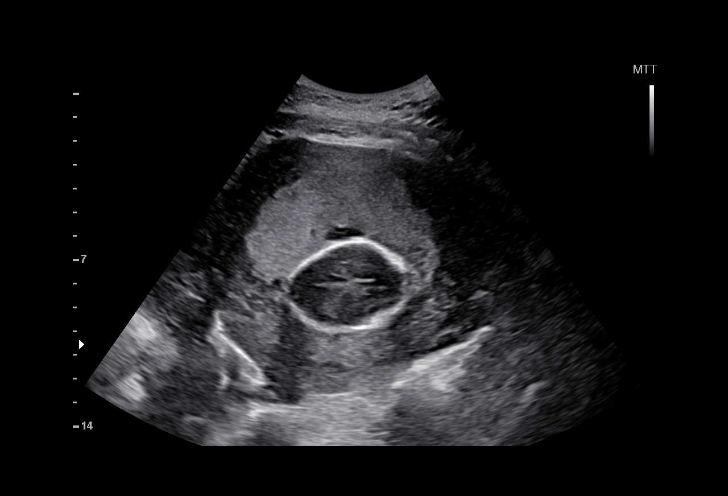
[im 19/27]
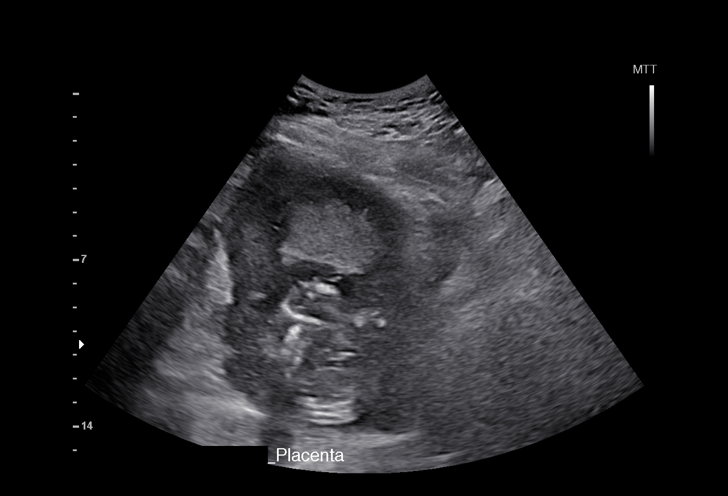
[im 21/27]
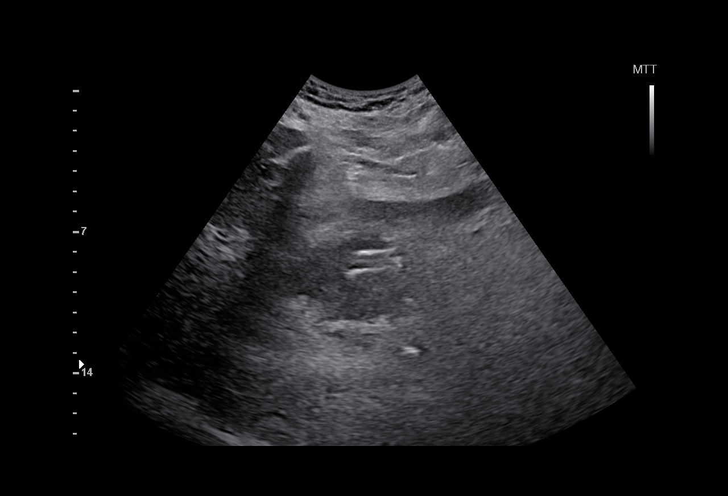
[im 23/27]
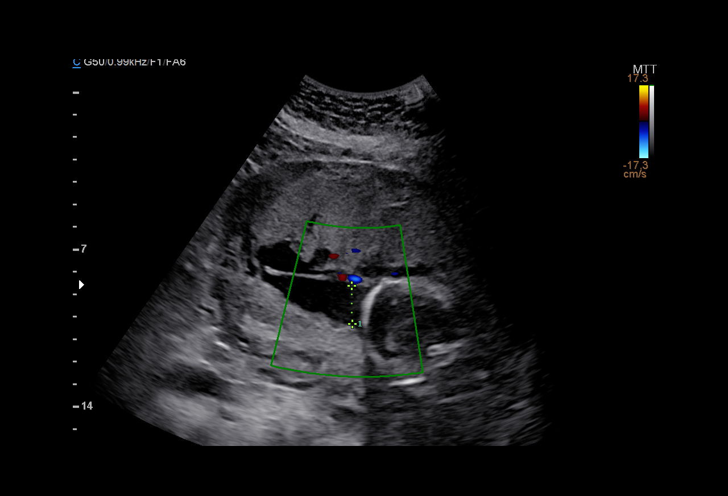
[im 25/27]
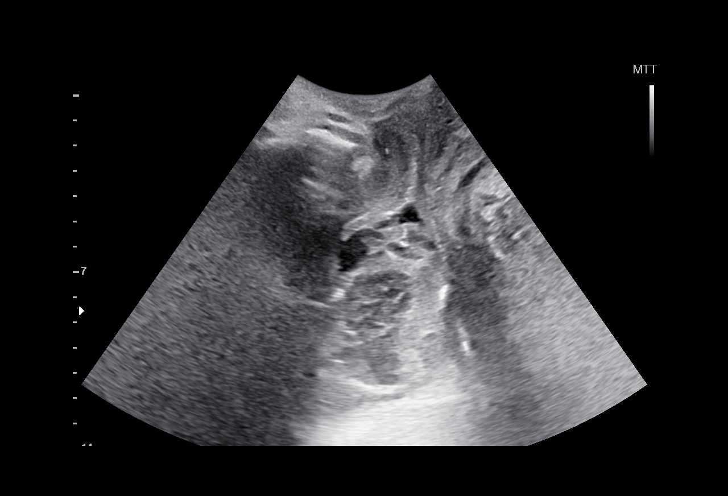
[im 27/27]
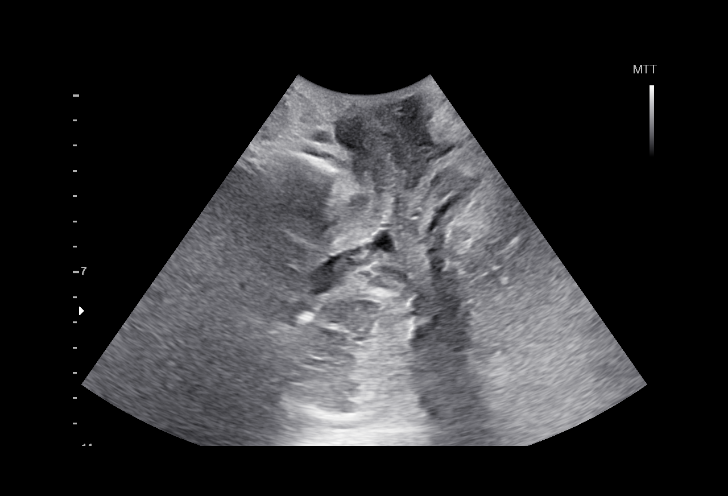

[15 of 27 positions shown; findings below may reference images not displayed]

OBSTETRICS REPORT
(Signed Final 01/10/2015 [DATE])

Service(s) Provided

US MFM OB LIMITED                                     76815.01
Indications

19 weeks gestation of pregnancy
Cervical cerclage suture present, second trimester
(placed 01/02/15)
Cervical incompetence, second trimester
Vaginal bleeding in pregnancy, second trimester
Premature rupture of membranes - leaking fluid
Fetal Evaluation

Num Of Fetuses:    1
Fetal Heart Rate:  205                          bpm
Cardiac Activity:  Tachycardia
Presentation:      Breech
Placenta:          Anterior, above cervical os
P. Cord            Previously Visualized
Insertion:

Amniotic Fluid
AFI FV:      Subjectively decreased
Larg Pckt:     1.7  cm
Gestational Age

LMP:           18w 6d        Date:  08/31/14                 EDD:   06/07/15
Clinical EDD:  19w 2d                                        EDD:   06/04/15
Best:          18w 6d     Det. By:  LMP  (08/31/14)          EDD:   06/07/15
Cervix Uterus Adnexa

Cervix:       Appears dilated 4.18 cm with portion of fetus
within the vagina.
Uterus:       No abnormality visualized.
Cul De Sac:   No free fluid seen.
Left Ovary:    Not visualized.
Right Ovary:   Not visualized.

Adnexa:     No abnormality visualized.
Impression

SIUP at 19w3d (remote read only)
breech fetus, imminent delivery likely given presenting breech
is prolapsing through the cervix into the vagina
cervix is open, approximately 4.2cm
Recommendations

Patient sent to L&D for imminent delivery of fetus at previable
gestational age.

questions or concerns.

## 2016-12-14 IMAGING — US US OB COMP LESS 14 WK
1 series · 15 of 28 positions shown · non-contrast
Comparison: None.

CLINICAL DATA: Pelvic bleeding. LMP is not recorded. Quantitative
beta HCG is not ordered.

EXAM:
OBSTETRIC <14 WK ULTRASOUND
TECHNIQUE: Transabdominal ultrasound was performed for evaluation of the
gestation as well as the maternal uterus and adnexal regions.

[Series 1: us ob comp less 14 wk · 15 of 34 slices shown]
[im 1/34]
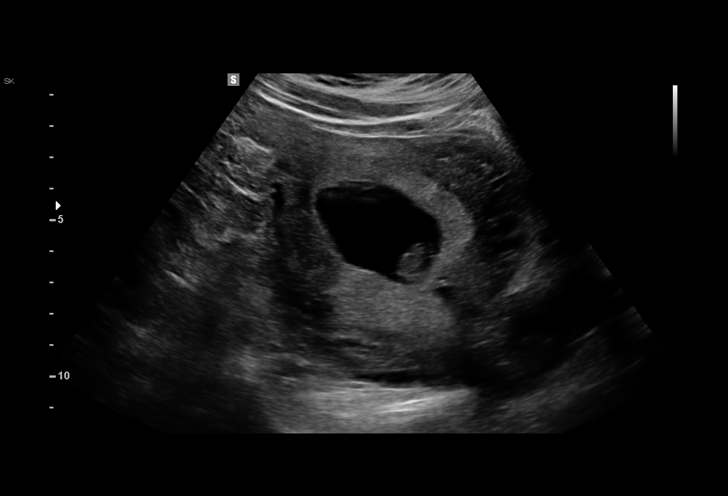
[im 3/34]
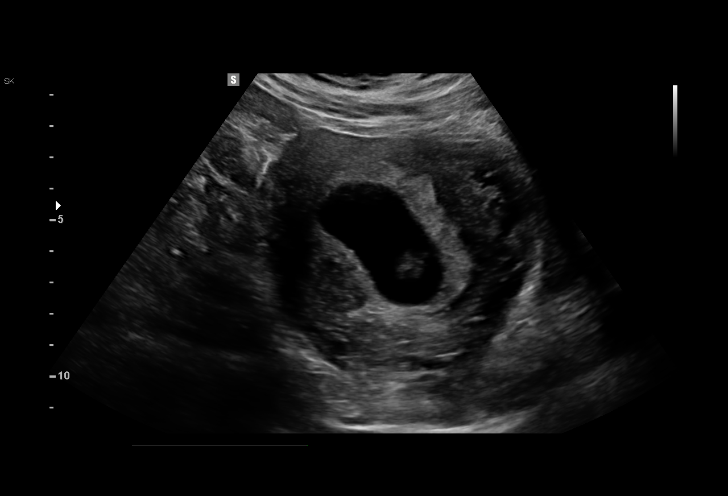
[im 5/34]
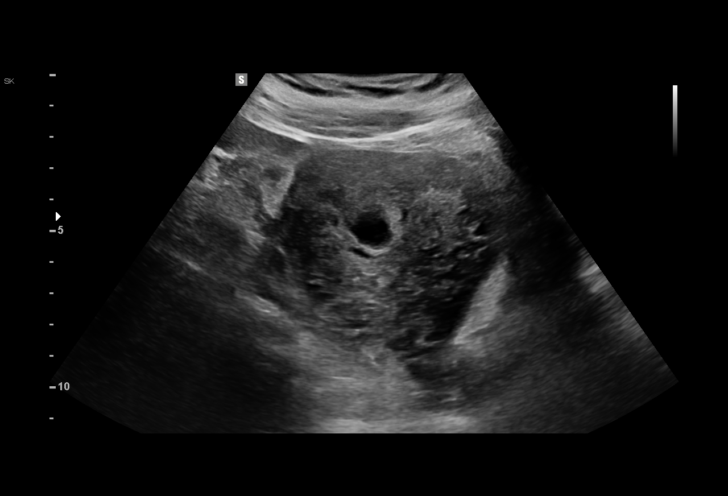
[im 8/34]
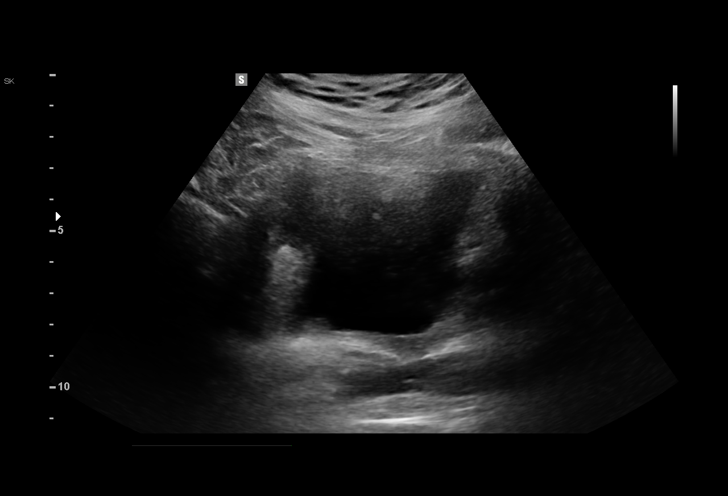
[im 10/34]
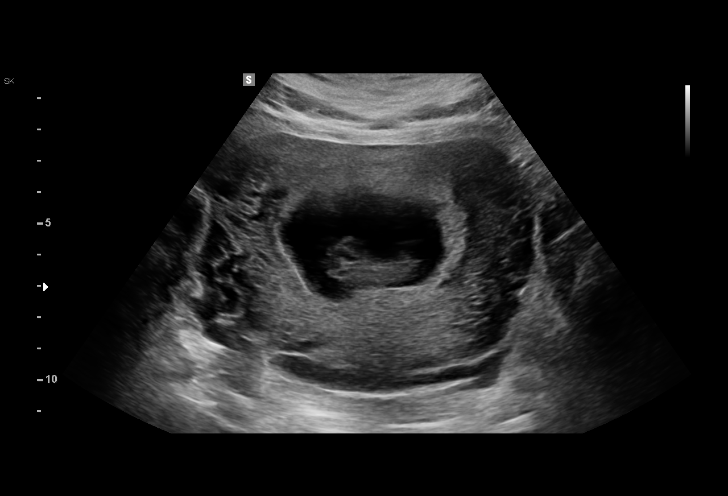
[im 13/34]
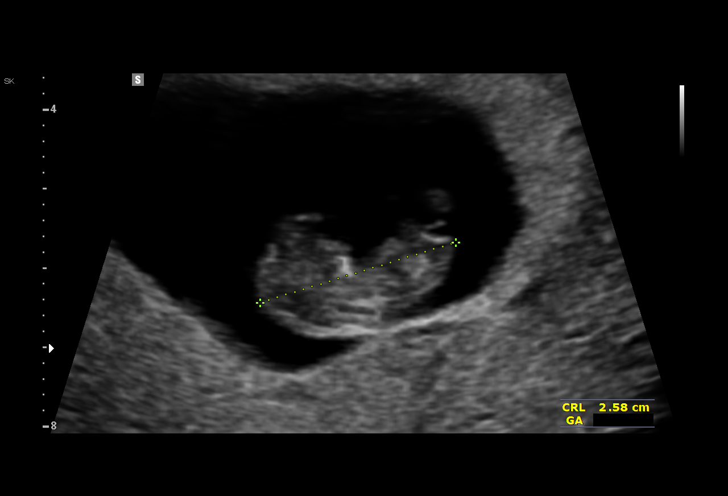
[im 15/34]
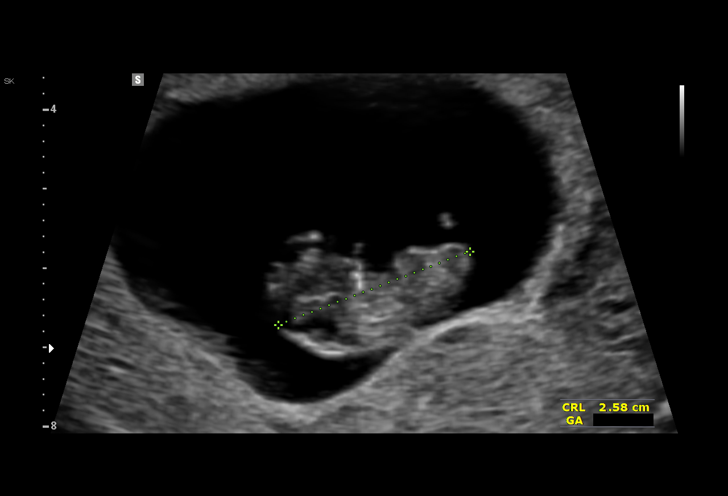
[im 18/34]
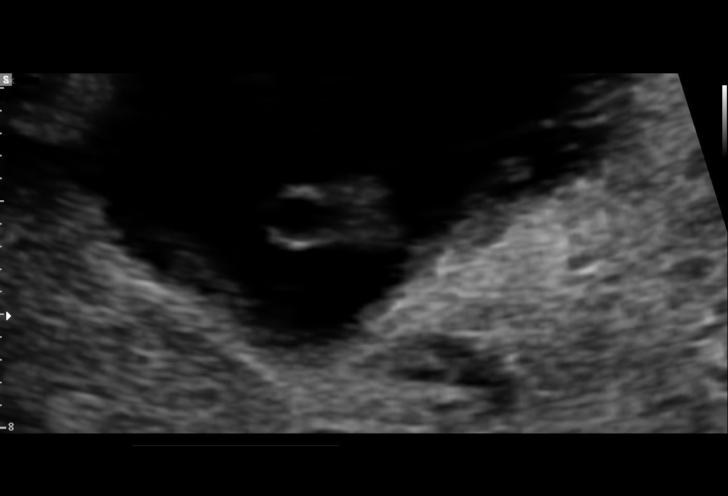
[im 19/34]
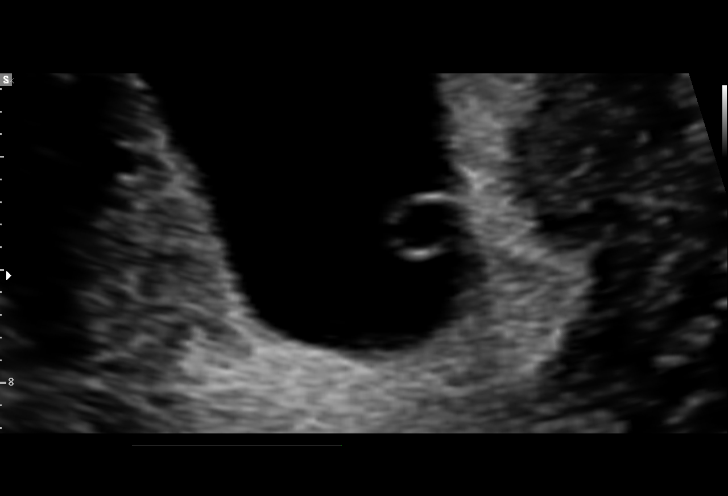
[im 21/34]
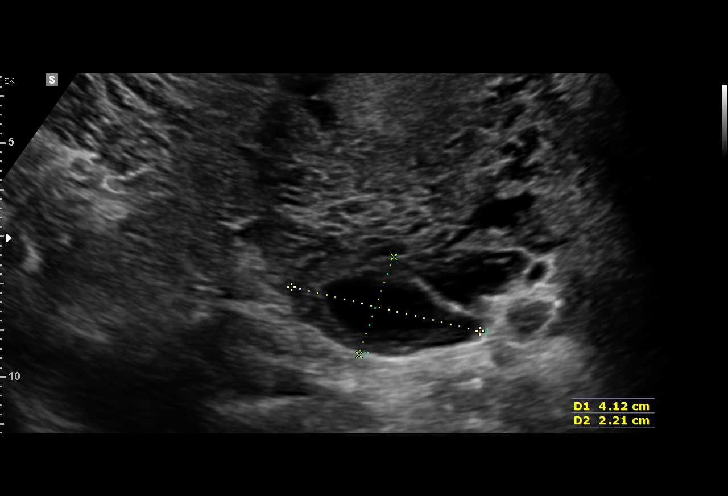
[im 24/34]
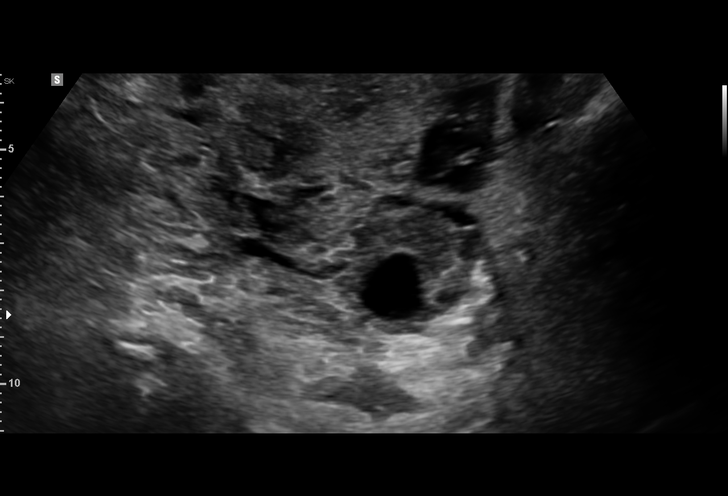
[im 26/34]
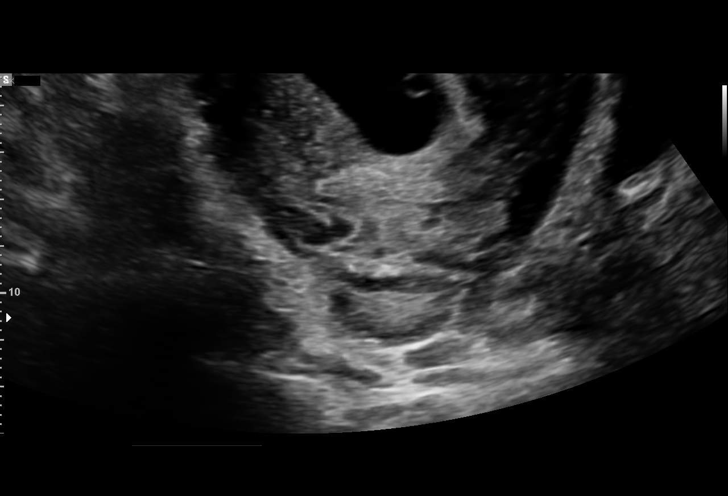
[im 29/34]
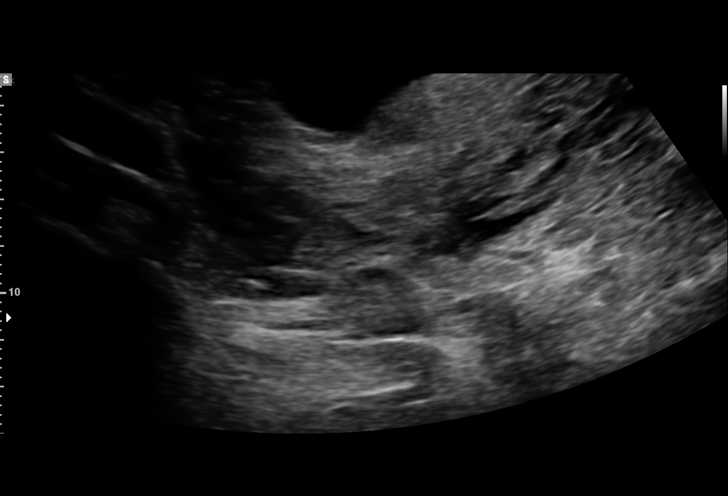
[im 31/34]
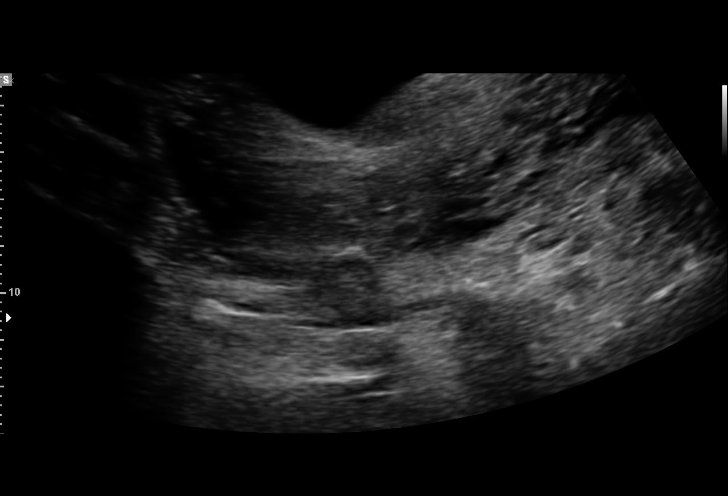
[im 34/34]
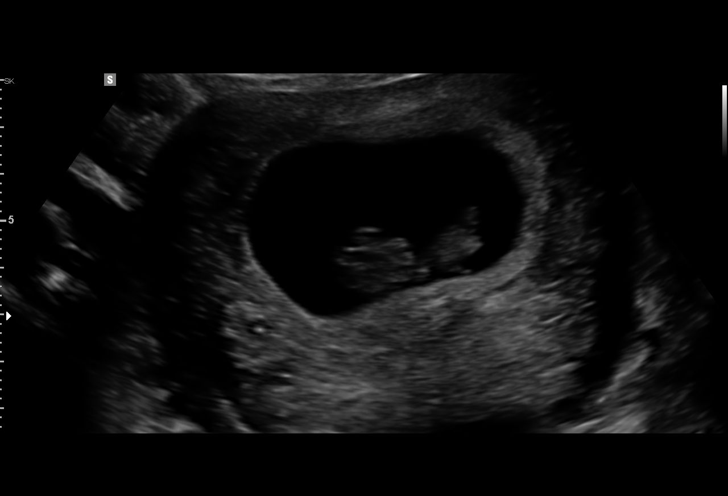

[15 of 28 positions shown; findings below may reference images not displayed]

FINDINGS: Intrauterine gestational sac: A single intrauterine pregnancy is
present.

Yolk sac:  Yolk sac is present.

Embryo:  Fetal pole is present.

Cardiac Activity: Fetal cardiac activity is observed.

Heart Rate: 173 bpm

CRL:   26.3  mm   9 w 3 d                  US EDC: 03/30/2016

Subchorionic hemorrhage:  None identified.

Maternal uterus/adnexae: No myometrial mass lesions identified.
Cervix is not visualized. Both ovaries are identified with limited
visualization. No abnormal adnexal masses. Normal follicular changes
seen. No free fluid in the pelvis.
IMPRESSION: Single intrauterine pregnancy. Estimated gestational age by
crown-rump length is 9 weeks 3 days. No acute complications
indicated.
